# Patient Record
Sex: Male | Born: 1980 | Race: White | Hispanic: No | Marital: Single | Smoking: Current every day smoker
Health system: Southern US, Community
[De-identification: ages and names within clinical notes are randomized; demographics above are authoritative.]

## PROBLEM LIST (undated history)

## (undated) HISTORY — PX: OTHER SURGICAL HISTORY: SHX169

---

## 2009-03-22 ENCOUNTER — Emergency Department (HOSPITAL_COMMUNITY): Admission: EM | Admit: 2009-03-22 | Discharge: 2009-03-22 | Payer: Self-pay | Admitting: Emergency Medicine

## 2010-10-13 LAB — DIFFERENTIAL
Basophils Absolute: 0 10*3/uL (ref 0.0–0.1)
Basophils Relative: 0 % (ref 0–1)
Eosinophils Absolute: 0.2 10*3/uL (ref 0.0–0.7)
Eosinophils Relative: 3 % (ref 0–5)
Lymphocytes Relative: 30 % (ref 12–46)
Lymphs Abs: 2.3 10*3/uL (ref 0.7–4.0)
Monocytes Absolute: 0.8 10*3/uL (ref 0.1–1.0)
Monocytes Relative: 11 % (ref 3–12)
Neutro Abs: 4.2 10*3/uL (ref 1.7–7.7)
Neutrophils Relative %: 55 % (ref 43–77)

## 2010-10-13 LAB — CBC
HCT: 45.5 % (ref 39.0–52.0)
Hemoglobin: 16 g/dL (ref 13.0–17.0)
MCHC: 35.2 g/dL (ref 30.0–36.0)
MCV: 88.7 fL (ref 78.0–100.0)
Platelets: 234 10*3/uL (ref 150–400)
RBC: 5.13 MIL/uL (ref 4.22–5.81)
RDW: 13.4 % (ref 11.5–15.5)
WBC: 7.6 10*3/uL (ref 4.0–10.5)

## 2010-10-13 LAB — URINALYSIS, ROUTINE W REFLEX MICROSCOPIC
Bilirubin Urine: NEGATIVE
Glucose, UA: NEGATIVE mg/dL
Ketones, ur: NEGATIVE mg/dL
Nitrite: NEGATIVE
Protein, ur: NEGATIVE mg/dL
Specific Gravity, Urine: 1.02 (ref 1.005–1.030)
Urobilinogen, UA: 0.2 mg/dL (ref 0.0–1.0)
pH: 6 (ref 5.0–8.0)

## 2010-10-13 LAB — COMPREHENSIVE METABOLIC PANEL
ALT: 41 U/L (ref 0–53)
AST: 24 U/L (ref 0–37)
Albumin: 4.2 g/dL (ref 3.5–5.2)
Alkaline Phosphatase: 52 U/L (ref 39–117)
BUN: 13 mg/dL (ref 6–23)
CO2: 30 mEq/L (ref 19–32)
Calcium: 9.4 mg/dL (ref 8.4–10.5)
Chloride: 104 mEq/L (ref 96–112)
Creatinine, Ser: 1.13 mg/dL (ref 0.4–1.5)
GFR calc Af Amer: >60 mL/min (ref >60)
GFR calc non Af Amer: >60 mL/min (ref >60)
Glucose, Bld: 96 mg/dL (ref 70–99)
Potassium: 4.2 mEq/L (ref 3.5–5.1)
Sodium: 138 mEq/L (ref 135–145)
Total Bilirubin: 0.8 mg/dL (ref 0.3–1.2)
Total Protein: 7.2 g/dL (ref 6.0–8.3)

## 2010-10-13 LAB — URINE MICROSCOPIC-ADD ON

## 2010-10-13 LAB — LIPASE, BLOOD: Lipase: 17 U/L (ref 11–59)

## 2012-01-14 ENCOUNTER — Encounter (HOSPITAL_COMMUNITY): Payer: Self-pay | Admitting: *Deleted

## 2012-01-14 DIAGNOSIS — M549 Dorsalgia, unspecified: Secondary | ICD-10-CM | POA: Insufficient documentation

## 2012-01-14 DIAGNOSIS — M542 Cervicalgia: Secondary | ICD-10-CM | POA: Insufficient documentation
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
## 2012-01-15 ENCOUNTER — Emergency Department (HOSPITAL_COMMUNITY)
Admission: EM | Admit: 2012-01-15 | Discharge: 2012-01-15 | Disposition: A | Discharge status: Home / Self Care | Payer: Self-pay | Attending: Emergency Medicine | Admitting: Emergency Medicine

## 2012-01-15 DIAGNOSIS — M5412 Radiculopathy, cervical region: Secondary | ICD-10-CM

## 2012-01-15 MED ORDER — HYDROCODONE-ACETAMINOPHEN 5-325 MG PO TABS: 1.0000 | ORAL_TABLET | ORAL | Status: AC | PRN

## 2012-01-15 MED ORDER — METHYLPREDNISOLONE SODIUM SUCC 125 MG IJ SOLR
125.0000 mg | Freq: Once | INTRAMUSCULAR | Status: AC
  Administered 2012-01-15: 125 mg via INTRAMUSCULAR
  Filled 2012-01-15: qty 2

## 2012-01-15 MED ORDER — HYDROCODONE-ACETAMINOPHEN 5-325 MG PO TABS
2.0000 | ORAL_TABLET | Freq: Once | ORAL | Status: AC
  Administered 2012-01-15: 2 via ORAL
  Filled 2012-01-15: qty 2

## 2012-01-15 MED ORDER — PREDNISONE 10 MG PO TABS: ORAL_TABLET | ORAL | Status: DC

## 2012-01-15 MED ORDER — METHOCARBAMOL 500 MG PO TABS
1000.0000 mg | ORAL_TABLET | Freq: Once | ORAL | Status: AC
  Administered 2012-01-15: 1000 mg via ORAL
  Filled 2012-01-15: qty 2

## 2012-01-15 MED ORDER — ONDANSETRON HCL 4 MG PO TABS
4.0000 mg | ORAL_TABLET | Freq: Once | ORAL | Status: AC
  Administered 2012-01-15: 4 mg via ORAL
  Filled 2012-01-15: qty 1

## 2012-01-15 MED ORDER — METHYLPREDNISOLONE SODIUM SUCC 125 MG IJ SOLR: 125.0000 mg | Freq: Once | INTRAMUSCULAR | Status: DC

## 2012-01-15 MED ORDER — METHOCARBAMOL 500 MG PO TABS: ORAL_TABLET | ORAL | Status: DC

## 2012-01-15 NOTE — ED Provider Notes (Signed)
Medical screening examination/treatment/procedure(s) were performed by non-physician practitioner and as supervising physician I was immediately available for consultation/collaboration.  Sunnie Nielsen, MD 01/15/12 [phone]

## 2012-01-15 NOTE — ED Provider Notes (Signed)
History     CSN: [id number]  Arrival date & time 01/14/12  2300   First MD Initiated Contact with Patient 01/14/12 2343      Chief Complaint  Patient presents with  . Back Pain    (Consider location/radiation/quality/duration/timing/severity/associated sxs/prior treatment) Patient is a 31 y.o. male presenting with back pain. The history is provided by the patient.  Back Pain  This is a chronic problem. The problem occurs daily. The problem has been gradually worsening. The pain is associated with lifting heavy objects. Pain location: ceervical. The quality of the pain is described as aching. Radiates to: left arm. The pain is at a severity of 8/10. Exacerbated by: lifting. The pain is the same all the time. Pertinent negatives include no chest pain, no abdominal pain and no dysuria. He has tried NSAIDs for the symptoms.    History reviewed. No pertinent past medical history.  Past Surgical History  Procedure Date  . Tubes inears     History reviewed. No pertinent family history.  History  Substance Use Topics  . Smoking status: Never Smoker   . Smokeless tobacco: Not on file  . Alcohol Use: Yes      Review of Systems  Constitutional: Negative for activity change.       All ROS Neg except as noted in HPI  HENT: Negative for nosebleeds and neck pain.   Eyes: Negative for photophobia and discharge.  Respiratory: Negative for cough, shortness of breath and wheezing.   Cardiovascular: Negative for chest pain and palpitations.  Gastrointestinal: Negative for abdominal pain and blood in stool.  Genitourinary: Negative for dysuria, frequency and hematuria.  Musculoskeletal: Positive for back pain. Negative for arthralgias.  Skin: Negative.   Neurological: Negative for dizziness, seizures and speech difficulty.  Psychiatric/Behavioral: Negative for hallucinations and confusion.    Allergies  Review of patient's allergies indicates no known allergies.  Home Medications    No current outpatient prescriptions on file.  BP 151/87  Pulse 92  Temp 98.2 F (36.8 C) (Oral)  Resp 20  Ht 6' (1.829 m)  Wt 195 lb (88.451 kg)  BMI 26.45 kg/m2  SpO2 98%  Physical Exam  Nursing note and vitals reviewed. Constitutional: He is oriented to person, place, and time. He appears well-developed and well-nourished.  Non-toxic appearance.  HENT:  Head: Normocephalic.  Right Ear: Tympanic membrane and external ear normal.  Left Ear: Tympanic membrane and external ear normal.  Eyes: EOM and lids are normal. Pupils are equal, round, and reactive to light.  Neck: Normal range of motion. Neck supple. Carotid bruit is not present.  Cardiovascular: Normal rate, regular rhythm, normal heart sounds, intact distal pulses and normal pulses.   Pulmonary/Chest: Breath sounds normal. No respiratory distress.  Abdominal: Soft. Bowel sounds are normal. There is no tenderness. There is no guarding.  Musculoskeletal: Normal range of motion.       Pain to palpation and ROM at the lower cervical spine area. No step off. No hot areas.  Lymphadenopathy:       Head (right side): No submandibular adenopathy present.       Head (left side): No submandibular adenopathy present.    He has no cervical adenopathy.  Neurological: He is alert and oriented to person, place, and time. He has normal strength. No cranial nerve deficit or sensory deficit. He exhibits normal muscle tone. Coordination normal.       Shoulder shruggs and motor strength symmetrical. Sensory symetrical. Grip symmetrical.  Skin: Skin  is warm and dry.  Psychiatric: He has a normal mood and affect. His speech is normal.    ED Course  Procedures (including critical care time)  Labs Reviewed - No data to display No results found.   No diagnosis found.    MDM  I have reviewed nursing notes, vital signs, and all appropriate lab and imaging results for this patient. Pt reports upper back and neck pain for nearly 1 year.  Worse over the past 3 weeks. No loss of power in the upper extremities. Hx of "pinched nerve" in the past. Did not receive intervention. No gross neuro deficit at this time. Rx for robaxin, decadron and norco # 20 given to patient.  PCP Dr Christell Constant.       Kathie Dike, [place] 01/15/12 [phone]

## 2012-03-07 ENCOUNTER — Emergency Department (HOSPITAL_COMMUNITY)
Admission: EM | Admit: 2012-03-07 | Discharge: 2012-03-07 | Disposition: A | Discharge status: Home / Self Care | Payer: Self-pay | Attending: Emergency Medicine | Admitting: Emergency Medicine

## 2012-03-07 DIAGNOSIS — L989 Disorder of the skin and subcutaneous tissue, unspecified: Secondary | ICD-10-CM | POA: Insufficient documentation

## 2012-03-07 DIAGNOSIS — R509 Fever, unspecified: Secondary | ICD-10-CM | POA: Insufficient documentation

## 2012-03-07 LAB — COMPREHENSIVE METABOLIC PANEL
ALT: 30 U/L (ref 0–53)
AST: 28 U/L (ref 0–37)
Albumin: 3.8 g/dL (ref 3.5–5.2)
Alkaline Phosphatase: 55 U/L (ref 39–117)
BUN: 13 mg/dL (ref 6–23)
CO2: 30 mEq/L (ref 19–32)
Calcium: 9.8 mg/dL (ref 8.4–10.5)
Chloride: 100 mEq/L (ref 96–112)
Creatinine, Ser: 0.95 mg/dL (ref 0.50–1.35)
GFR calc Af Amer: >90 mL/min (ref >90)
GFR calc non Af Amer: >90 mL/min (ref >90)
Glucose, Bld: 111 mg/dL — ABNORMAL HIGH (ref 70–99)
Potassium: 4.7 mEq/L (ref 3.5–5.1)
Sodium: 137 mEq/L (ref 135–145)
Total Bilirubin: 0.4 mg/dL (ref 0.3–1.2)
Total Protein: 7.7 g/dL (ref 6.0–8.3)

## 2012-03-07 LAB — URINALYSIS, ROUTINE W REFLEX MICROSCOPIC
Bilirubin Urine: NEGATIVE
Glucose, UA: NEGATIVE mg/dL
Ketones, ur: NEGATIVE mg/dL
Leukocytes, UA: NEGATIVE
Nitrite: NEGATIVE
Protein, ur: NEGATIVE mg/dL
Specific Gravity, Urine: 1.025 (ref 1.005–1.030)
Urobilinogen, UA: 0.2 mg/dL (ref 0.0–1.0)
pH: 6 (ref 5.0–8.0)

## 2012-03-07 LAB — CBC WITH DIFFERENTIAL/PLATELET
Basophils Absolute: 0 10*3/uL (ref 0.0–0.1)
Basophils Relative: 0 % (ref 0–1)
Eosinophils Absolute: 0.1 10*3/uL (ref 0.0–0.7)
Eosinophils Relative: 1 % (ref 0–5)
HCT: 45.7 % (ref 39.0–52.0)
Hemoglobin: 16.1 g/dL (ref 13.0–17.0)
Lymphocytes Relative: 24 % (ref 12–46)
Lymphs Abs: 1.8 10*3/uL (ref 0.7–4.0)
MCH: 30.5 pg (ref 26.0–34.0)
MCHC: 35.2 g/dL (ref 30.0–36.0)
MCV: 86.6 fL (ref 78.0–100.0)
Monocytes Absolute: 1.2 10*3/uL — ABNORMAL HIGH (ref 0.1–1.0)
Monocytes Relative: 15 % — ABNORMAL HIGH (ref 3–12)
Neutro Abs: 4.5 10*3/uL (ref 1.7–7.7)
Neutrophils Relative %: 60 % (ref 43–77)
Platelets: 152 10*3/uL (ref 150–400)
RBC: 5.28 MIL/uL (ref 4.22–5.81)
RDW: 14.1 % (ref 11.5–15.5)
WBC Morphology: INCREASED
WBC: 7.6 10*3/uL (ref 4.0–10.5)

## 2012-03-07 LAB — URINE MICROSCOPIC-ADD ON

## 2012-03-07 MED ORDER — ONDANSETRON 8 MG PO TBDP
8.0000 mg | ORAL_TABLET | Freq: Once | ORAL | Status: AC
  Administered 2012-03-07: 8 mg via ORAL
  Filled 2012-03-07: qty 1

## 2012-03-07 MED ORDER — DOXYCYCLINE HYCLATE 100 MG PO TABS
100.0000 mg | ORAL_TABLET | Freq: Once | ORAL | Status: AC
  Administered 2012-03-07: 100 mg via ORAL
  Filled 2012-03-07: qty 1

## 2012-03-07 MED ORDER — KETOROLAC TROMETHAMINE 60 MG/2ML IM SOLN
60.0000 mg | Freq: Once | INTRAMUSCULAR | Status: AC
  Administered 2012-03-07: 60 mg via INTRAMUSCULAR
  Filled 2012-03-07: qty 2

## 2012-03-07 MED ORDER — DOXYCYCLINE HYCLATE 100 MG PO CAPS: 100.0000 mg | ORAL_CAPSULE | Freq: Two times a day (BID) | ORAL | Status: AC

## 2012-03-07 MED ORDER — PROMETHAZINE HCL 25 MG PO TABS: 25.0000 mg | ORAL_TABLET | Freq: Four times a day (QID) | ORAL | Status: DC | PRN

## 2012-03-07 NOTE — ED Notes (Signed)
Pt medicated for nauseas and generalized pain. Pt states he is unable to collect urine sample at this time.

## 2012-03-07 NOTE — ED Notes (Signed)
Pt states symptoms first began Tuesday. Denies vomiting. NAD.

## 2012-03-07 NOTE — ED Provider Notes (Signed)
History     CSN: [id number]  Arrival date & time 03/07/12  1224   First MD Initiated Contact with Patient 03/07/12 1229      Chief Complaint  Patient presents with  . Fever    (Consider location/radiation/quality/duration/timing/severity/associated sxs/prior treatment) HPI Comments: Patient c/o frontal headache, generalized weakness and intermittent fever x 3 days.  Also c/o watery stool 1-2 times yesterday and nausea.  Having fever, sweats and chills last evening.  Last took tylenol at 10:00 am this morning.  States he has been eating and drinking fluids normally.  He denies exposure to ticks recently, but notes that he had a large "red spot" to his left lower leg last week that has since improved.  He denies vomiting, melena, neck pain or stiffness, visual changes, cough, sore throat, ear pain, dizziness, abdominal pain or joint pains.  Patient is a 31 y.o. male presenting with fever. The history is provided by the patient.  Fever Primary symptoms of the febrile illness include fever, fatigue, headaches, nausea and diarrhea. Primary symptoms do not include visual change, cough, wheezing, shortness of breath, abdominal pain, vomiting, dysuria, altered mental status, myalgias, arthralgias or rash. The current episode started 3 to 5 days ago. This is a new problem. The problem has not changed since onset. The headache began more than 2 days ago. The headache developed gradually. Headache is a new problem. The headache is present intermittently. The headache is associated with weakness. The headache is not associated with aura, photophobia, double vision, eye pain, decreased vision, visual change, stiff neck, neck stiffness, paresthesias or loss of balance.  The diarrhea began 2 days ago. The diarrhea is semi-solid and watery. The diarrhea occurs 2 to 4 times per day. Risk factors: none.    No past medical history on file.  Past Surgical History  Procedure Date  . Tubes inears     No  family history on file.  History  Substance Use Topics  . Smoking status: Never Smoker   . Smokeless tobacco: Not on file  . Alcohol Use: Yes      Review of Systems  Constitutional: Positive for fever, chills and fatigue. Negative for activity change and appetite change.  HENT: Negative for ear pain, congestion, sore throat, facial swelling, trouble swallowing, neck pain and neck stiffness.   Eyes: Negative for double vision, photophobia and pain.  Respiratory: Negative for cough, chest tightness, shortness of breath and wheezing.   Cardiovascular: Negative for chest pain.  Gastrointestinal: Positive for nausea and diarrhea. Negative for vomiting, abdominal pain, blood in stool and abdominal distention.  Genitourinary: Negative for dysuria, hematuria, flank pain, decreased urine volume and difficulty urinating.  Musculoskeletal: Negative for myalgias, back pain and arthralgias.  Skin: Negative for rash.  Neurological: Positive for weakness and headaches. Negative for dizziness, syncope, facial asymmetry, speech difficulty, light-headedness, numbness, paresthesias and loss of balance.  Psychiatric/Behavioral: Negative for confusion, decreased concentration and altered mental status.  All other systems reviewed and are negative.    Allergies  Review of patient's allergies indicates no known allergies.  Home Medications   Current Outpatient Rx  Name Route Sig Dispense Refill  . METHOCARBAMOL 500 MG PO TABS  2 po tid for spasm 30 tablet 0  . PREDNISONE 10 MG PO TABS  6,5,4,3,2,1 - Take with food 21 tablet 0    BP 132/89  Pulse 79  Temp 98.2 F (36.8 C) (Oral)  Resp 20  Ht 6\' 2"  (1.88 m)  Wt 205 lb (92.987  kg)  BMI 26.32 kg/m2  SpO2 99%  Physical Exam  Nursing note and vitals reviewed. Constitutional: He is oriented to person, place, and time. He appears well-developed and well-nourished. No distress.  HENT:  Head: Normocephalic and atraumatic.  Right Ear: Tympanic  membrane and ear canal normal.  Left Ear: Tympanic membrane and ear canal normal.  Nose: Nose normal.  Mouth/Throat: Uvula is midline, oropharynx is clear and moist and mucous membranes are normal.  Eyes: Pupils are equal, round, and reactive to light.  Neck: Normal range of motion and phonation normal. Neck supple. No spinous process tenderness and no muscular tenderness present. Normal range of motion present. No Brudzinski's sign and no Kernig's sign noted.  Cardiovascular: Normal rate, regular rhythm, normal heart sounds and intact distal pulses.   No murmur heard. Pulmonary/Chest: Effort normal and breath sounds normal. No respiratory distress. He has no wheezes. He has no rales. He exhibits no tenderness.  Abdominal: Soft. He exhibits no distension and no mass. There is no tenderness. There is no rebound and no guarding.  Musculoskeletal: He exhibits no edema.  Lymphadenopathy:    He has no cervical adenopathy.  Neurological: He is alert and oriented to person, place, and time. He exhibits normal muscle tone. Coordination normal.  Skin: Skin is warm and dry.       Dime sized slightly erythematous macule to the distal left lower leg.  No pustule, petechiae, vesicles or edema.    ED Course  Procedures (including critical care time)  Labs Reviewed  CBC WITH DIFFERENTIAL - Abnormal; Notable for the following:    Monocytes Relative 15 (*)     Monocytes Absolute 1.2 (*)     All other components within normal limits  COMPREHENSIVE METABOLIC PANEL - Abnormal; Notable for the following:    Glucose, Bld 111 (*)     All other components within normal limits  URINALYSIS, ROUTINE W REFLEX MICROSCOPIC - Abnormal; Notable for the following:    Hgb urine dipstick LARGE (*)     All other components within normal limits  URINE MICROSCOPIC-ADD ON - Abnormal; Notable for the following:    Bacteria, UA FEW (*)     Casts WBC CAST (*)     All other components within normal limits  ROCKY MTN  SPOTTED FVR AB, IGG-BLOOD  ROCKY MTN SPOTTED FVR AB, IGM-BLOOD     RMSF titer is pending   MDM   Patient is non-toxic appearing, mucous membranes are moist, vitals reviewed.  No meningeal signs. Since pt reports hx of fever and no known origin, with lesion to left lower leg.  I will start doxy and obtain RMSF titer.  I have discussed possibility of tick related illness with the pt and he agrees to care plan and close f/u with his PMD or to return here if his sx's worsen.    Spoke with [place] in lab, advised to order both RMSF IgG and IgM for tier.  The patient appears reasonably screened and/or stabilized for discharge and I doubt any other medical condition or other [facility] requiring further screening, evaluation, or treatment in the ED at this time prior to discharge.        Chrissi Crow L. [place], [place] 03/08/12 2205

## 2012-03-07 NOTE — ED Notes (Signed)
Fever, feels weak, headache, nausea,diarrhea yesterday.  T 104 last pm .  No cough.

## 2012-03-10 NOTE — ED Provider Notes (Signed)
Medical screening examination/treatment/procedure(s) were performed by non-physician practitioner and as supervising physician I was immediately available for consultation/collaboration.   Wilkin Lippy W. Ladona Rosten, MD 03/10/12 1009 

## 2012-03-11 LAB — ROCKY MTN SPOTTED FVR AB, IGG-BLOOD: RMSF IgG: 0.53 IV

## 2012-03-11 LAB — ROCKY MTN SPOTTED FVR AB, IGM-BLOOD: RMSF IgM: 0.68 IV (ref 0.00–0.89)

## 2012-12-03 ENCOUNTER — Emergency Department (HOSPITAL_COMMUNITY)
Admission: EM | Admit: 2012-12-03 | Discharge: 2012-12-03 | Discharge status: Against Medical Advice | Payer: Self-pay | Attending: Emergency Medicine | Admitting: Emergency Medicine

## 2012-12-03 ENCOUNTER — Encounter (HOSPITAL_COMMUNITY): Payer: Self-pay | Admitting: *Deleted

## 2012-12-03 DIAGNOSIS — R509 Fever, unspecified: Secondary | ICD-10-CM | POA: Insufficient documentation

## 2012-12-03 DIAGNOSIS — R112 Nausea with vomiting, unspecified: Secondary | ICD-10-CM | POA: Insufficient documentation

## 2012-12-03 DIAGNOSIS — R1310 Dysphagia, unspecified: Secondary | ICD-10-CM | POA: Insufficient documentation

## 2012-12-03 DIAGNOSIS — H538 Other visual disturbances: Secondary | ICD-10-CM | POA: Insufficient documentation

## 2012-12-03 DIAGNOSIS — R109 Unspecified abdominal pain: Secondary | ICD-10-CM | POA: Insufficient documentation

## 2012-12-03 DIAGNOSIS — M549 Dorsalgia, unspecified: Secondary | ICD-10-CM | POA: Insufficient documentation

## 2012-12-03 DIAGNOSIS — R42 Dizziness and giddiness: Secondary | ICD-10-CM | POA: Insufficient documentation

## 2012-12-03 DIAGNOSIS — R51 Headache: Secondary | ICD-10-CM | POA: Insufficient documentation

## 2012-12-03 DIAGNOSIS — J029 Acute pharyngitis, unspecified: Secondary | ICD-10-CM | POA: Insufficient documentation

## 2012-12-03 DIAGNOSIS — M79609 Pain in unspecified limb: Secondary | ICD-10-CM | POA: Insufficient documentation

## 2012-12-03 DIAGNOSIS — IMO0001 Reserved for inherently not codable concepts without codable children: Secondary | ICD-10-CM | POA: Insufficient documentation

## 2012-12-03 LAB — CBC
HCT: 46.1 % (ref 39.0–52.0)
Hemoglobin: 16.4 g/dL (ref 13.0–17.0)
MCH: 31.3 pg (ref 26.0–34.0)
MCHC: 35.6 g/dL (ref 30.0–36.0)
MCV: 88 fL (ref 78.0–100.0)
Platelets: 189 10*3/uL (ref 150–400)
RBC: 5.24 MIL/uL (ref 4.22–5.81)
RDW: 13.1 % (ref 11.5–15.5)
WBC: 14.4 10*3/uL — ABNORMAL HIGH (ref 4.0–10.5)

## 2012-12-03 LAB — URINE MICROSCOPIC-ADD ON

## 2012-12-03 LAB — URINALYSIS, ROUTINE W REFLEX MICROSCOPIC
Bilirubin Urine: NEGATIVE
Glucose, UA: NEGATIVE mg/dL
Ketones, ur: NEGATIVE mg/dL
Leukocytes, UA: NEGATIVE
Nitrite: NEGATIVE
Protein, ur: NEGATIVE mg/dL
Specific Gravity, Urine: <1.005 — ABNORMAL LOW (ref 1.005–1.030)
Urobilinogen, UA: 0.2 mg/dL (ref 0.0–1.0)
pH: 5.5 (ref 5.0–8.0)

## 2012-12-03 LAB — BASIC METABOLIC PANEL
BUN: 7 mg/dL (ref 6–23)
CO2: 26 mEq/L (ref 19–32)
Calcium: 9.5 mg/dL (ref 8.4–10.5)
Chloride: 94 mEq/L — ABNORMAL LOW (ref 96–112)
Creatinine, Ser: 0.86 mg/dL (ref 0.50–1.35)
GFR calc Af Amer: >90 mL/min (ref >90)
GFR calc non Af Amer: >90 mL/min (ref >90)
Glucose, Bld: 126 mg/dL — ABNORMAL HIGH (ref 70–99)
Potassium: 3.5 mEq/L (ref 3.5–5.1)
Sodium: 134 mEq/L — ABNORMAL LOW (ref 135–145)

## 2012-12-03 NOTE — ED Provider Notes (Signed)
History    Scribed for No att. providers found, the patient was seen in room APA18/APA18. This chart was scribed by Lewanda Rife, ED scribe. Patient's care was started at 2000  CSN: [id number]  Arrival date & time 12/03/12  1554   First MD Initiated Contact with Patient 12/03/12 1933      Chief Complaint  Patient presents with  . Nausea  . Headache  . Fever    (Consider location/radiation/quality/duration/timing/severity/associated sxs/prior treatment) The history is provided by the patient.  HPI Comments: Phillip Abbott is a 32 y.o. male who presents to the Emergency Department complaining of several episodes of emesis onset 2 days. Reports associated improving waxing and waning nausea, fever highest of 103, generalized myalgias, dizziness, sore throat, painful swallowing, blurry vision, headache, bilateral leg pain, back pain, and abdominal pains. Denies having episodes of emesis, nausea, and abdominal pain today. Denies associated cough, diarrhea, rash, chest pain, shortness of breath, leg swelling, dysuria, hematuria, and bleeding disorder. Denies any aggravating and alleviating symptoms. Denies taking medications PTA to relieve symptoms. No PCP. Reports hx of strep throat.  History reviewed. No pertinent past medical history.  Past Surgical History  Procedure Laterality Date  . Tubes inears      No family history on file.  History  Substance Use Topics  . Smoking status: Never Smoker   . Smokeless tobacco: Not on file  . Alcohol Use: Yes      Review of Systems  Constitutional: Positive for fever.  HENT: Positive for sore throat. Negative for neck pain.        Painful swallowing   Eyes: Positive for visual disturbance. Negative for redness.  Respiratory: Negative for cough and shortness of breath.   Cardiovascular: Negative for chest pain and leg swelling.  Gastrointestinal: Positive for nausea, vomiting and abdominal pain. Negative for diarrhea.   Genitourinary: Negative for dysuria and hematuria.  Musculoskeletal: Positive for myalgias and back pain.  Skin: Negative for rash.  Neurological: Positive for dizziness and headaches.  Hematological: Does not bruise/bleed easily.  Psychiatric/Behavioral: Negative for confusion.   A complete 10 system review of systems was obtained and all systems are negative except as noted in the HPI and PMH.   Allergies  Review of patient's allergies indicates no known allergies.  Home Medications   Current Outpatient Rx  Name  Route  Sig  Dispense  Refill  . acetaminophen (TYLENOL) 500 MG tablet   Oral   Take 500 mg by mouth every 6 (six) hours as needed for pain.           BP 163/99  Pulse 113  Temp(Src) 100.1 F (37.8 C) (Oral)  Resp 16  Ht 6' (1.829 m)  Wt 206 lb (93.441 kg)  BMI 27.93 kg/m2  SpO2 97%  Physical Exam  Nursing note and vitals reviewed. Constitutional: He is oriented to person, place, and time. He appears well-developed and well-nourished. No distress.  HENT:  Head: Normocephalic and atraumatic.  Mouth/Throat: Uvula is midline and mucous membranes are normal. Oropharyngeal exudate and posterior oropharyngeal erythema present.  Eyes: Conjunctivae and EOM are normal. No scleral icterus.  Neck: Normal range of motion. Neck supple. No tracheal deviation present.  Cardiovascular: Normal rate, regular rhythm and normal heart sounds.   Pulmonary/Chest: Effort normal and breath sounds normal. No respiratory distress. He has no wheezes. He has no rales.  Abdominal: Soft. Bowel sounds are normal. He exhibits no distension. There is no tenderness.  Musculoskeletal: Normal range of motion.  Lymphadenopathy:    He has no cervical adenopathy.  Neurological: He is alert and oriented to person, place, and time.  Skin: Skin is warm and dry.  Psychiatric: He has a normal mood and affect. His behavior is normal.    ED Course  Procedures (including critical care  time) Medications - No data to display 8:15 PM PT decided to leave AMA after being informed of additional testing to be completed including rapid strep test, basic labs, and chest x-ray  Labs Reviewed  CBC - Abnormal; Notable for the following:    WBC 14.4 (*)    All other components within normal limits  BASIC METABOLIC PANEL - Abnormal; Notable for the following:    Sodium 134 (*)    Chloride 94 (*)    Glucose, Bld 126 (*)    All other components within normal limits  URINALYSIS, ROUTINE W REFLEX MICROSCOPIC - Abnormal; Notable for the following:    Specific Gravity, Urine <1.005 (*)    Hgb urine dipstick LARGE (*)    All other components within normal limits  URINE MICROSCOPIC-ADD ON   No results found. Results for orders placed during the hospital encounter of 12/03/12  CBC      Result Value Range   WBC 14.4 (*) 4.0 - 10.5 K/uL   RBC 5.24  4.22 - 5.81 MIL/uL   Hemoglobin 16.4  13.0 - 17.0 g/dL   HCT 16.1  09.6 - 04.5 %   MCV 88.0  78.0 - 100.0 fL   MCH 31.3  26.0 - 34.0 pg   MCHC 35.6  30.0 - 36.0 g/dL   RDW 40.9  81.1 - 91.4 %   Platelets 189  150 - 400 K/uL  BASIC METABOLIC PANEL      Result Value Range   Sodium 134 (*) 135 - 145 mEq/L   Potassium 3.5  3.5 - 5.1 mEq/L   Chloride 94 (*) 96 - 112 mEq/L   CO2 26  19 - 32 mEq/L   Glucose, Bld 126 (*) 70 - 99 mg/dL   BUN 7  6 - 23 mg/dL   Creatinine, Ser 7.82  0.50 - 1.35 mg/dL   Calcium 9.5  8.4 - 95.6 mg/dL   GFR calc non Af Amer >90  >90 mL/min   GFR calc Af Amer >90  >90 mL/min  URINALYSIS, ROUTINE W REFLEX MICROSCOPIC      Result Value Range   Color, Urine YELLOW  YELLOW   APPearance CLEAR  CLEAR   Specific Gravity, Urine <1.005 (*) 1.005 - 1.030   pH 5.5  5.0 - 8.0   Glucose, UA NEGATIVE  NEGATIVE mg/dL   Hgb urine dipstick LARGE (*) NEGATIVE   Bilirubin Urine NEGATIVE  NEGATIVE   Ketones, ur NEGATIVE  NEGATIVE mg/dL   Protein, ur NEGATIVE  NEGATIVE mg/dL   Urobilinogen, UA 0.2  0.0 - 1.0 mg/dL   Nitrite  NEGATIVE  NEGATIVE   Leukocytes, UA NEGATIVE  NEGATIVE  URINE MICROSCOPIC-ADD ON      Result Value Range   WBC, UA 0-2  <3 WBC/hpf   RBC / HPF 3-6  <3 RBC/hpf   Bacteria, UA RARE  RARE     1. Fever   2. Pharyngitis       MDM  Patient unfortunately had a long way but was seen by [place] and plan was in place for strep test and basic labs and a chest x-ray patient decided he wanted to go home. Clinically is very  suspicious for strep throat. Patient says the amoxicillin at home and he would you start taking that. Patient was welcome to return at any point in time. Patient currently nontoxic no acute distress but does have a low-grade fever. Question saturations were normal. Patient does have a leukocytosis urinalysis was negative and not consistent with dehydration.      I personally performed the services described in this documentation, which was scribed in my presence. The recorded information has been reviewed and is accurate.     Shelda Jakes, MD 12/03/12 2041

## 2012-12-03 NOTE — ED Notes (Signed)
Pt states "violent vomiting" 2 days ago with abdominal pain which has subsided. Pt states nausea, fever (highest of 104). Generalized weakness, headache, rib pain. Dizziness.

## 2012-12-03 NOTE — ED Notes (Signed)
Patient stated he didn't want to stay for further testing.  Patient instructed to return to ER for any worsening symptoms.  Patient ambulatory; signed AMA form.  Benefits and risks given to patient.

## 2013-01-25 ENCOUNTER — Emergency Department (HOSPITAL_COMMUNITY)
Admission: EM | Admit: 2013-01-25 | Discharge: 2013-01-25 | Discharge status: Against Medical Advice | Payer: Self-pay | Attending: Emergency Medicine | Admitting: Emergency Medicine

## 2013-01-25 ENCOUNTER — Encounter (HOSPITAL_COMMUNITY): Payer: Self-pay

## 2013-01-25 DIAGNOSIS — F1910 Other psychoactive substance abuse, uncomplicated: Secondary | ICD-10-CM | POA: Insufficient documentation

## 2013-01-25 DIAGNOSIS — F17200 Nicotine dependence, unspecified, uncomplicated: Secondary | ICD-10-CM | POA: Insufficient documentation

## 2013-01-25 NOTE — ED Notes (Signed)
Complain of headache and blood pressure elevated. States he uses cocaine three days ago.

## 2013-01-25 NOTE — ED Notes (Signed)
Delay explained to pt and that EDP was coming to see pt next. Pt reported," i am ready to leave." pt told risks of leaving and still reported he wanted to leave. EDP aware.

## 2013-01-25 NOTE — ED Notes (Addendum)
Pt becoming agitated and reported he wanted to live AMA. Talked with pt on risks of leaving but that ED staff could not make pt stay. Pt family at bedside and has convinced pt to stay. Pt reports " i will sit up here all night and they will tell me its because of cocaine." Pt reports, "it is not because of cocaine, this has happened before and that episode was several days out from my last use of cocaine as well." Pt mother reports that pt has bloody stools x1 year. Pt reports "i drink a lot everyday, that's all that is." Pt mother reports her brother presented the same way my son is now when they found out my brother had colon cancer.

## 2014-05-19 ENCOUNTER — Encounter (HOSPITAL_COMMUNITY): Payer: Self-pay | Admitting: Emergency Medicine

## 2014-05-19 ENCOUNTER — Emergency Department (HOSPITAL_COMMUNITY)
Admission: EM | Admit: 2014-05-19 | Discharge: 2014-05-20 | Disposition: A | Discharge status: Home / Self Care | Payer: 59 | Attending: Emergency Medicine | Admitting: Emergency Medicine

## 2014-05-19 DIAGNOSIS — Z046 Encounter for general psychiatric examination, requested by authority: Secondary | ICD-10-CM | POA: Diagnosis present

## 2014-05-19 DIAGNOSIS — F10920 Alcohol use, unspecified with intoxication, uncomplicated: Secondary | ICD-10-CM

## 2014-05-19 DIAGNOSIS — F10129 Alcohol abuse with intoxication, unspecified: Secondary | ICD-10-CM | POA: Diagnosis not present

## 2014-05-19 DIAGNOSIS — Z720 Tobacco use: Secondary | ICD-10-CM | POA: Diagnosis not present

## 2014-05-19 LAB — CBC WITH DIFFERENTIAL/PLATELET
Basophils Absolute: 0 10*3/uL (ref 0.0–0.1)
Basophils Relative: 0 % (ref 0–1)
Eosinophils Absolute: 0.2 10*3/uL (ref 0.0–0.7)
Eosinophils Relative: 2 % (ref 0–5)
HCT: 48.5 % (ref 39.0–52.0)
Hemoglobin: 17.2 g/dL — ABNORMAL HIGH (ref 13.0–17.0)
Lymphocytes Relative: 15 % (ref 12–46)
Lymphs Abs: 1.5 10*3/uL (ref 0.7–4.0)
MCH: 30.8 pg (ref 26.0–34.0)
MCHC: 35.5 g/dL (ref 30.0–36.0)
MCV: 86.8 fL (ref 78.0–100.0)
Monocytes Absolute: 0.7 10*3/uL (ref 0.1–1.0)
Monocytes Relative: 7 % (ref 3–12)
Neutro Abs: 7.6 10*3/uL (ref 1.7–7.7)
Neutrophils Relative %: 76 % (ref 43–77)
Platelets: 239 10*3/uL (ref 150–400)
RBC: 5.59 MIL/uL (ref 4.22–5.81)
RDW: 13.3 % (ref 11.5–15.5)
WBC: 9.9 10*3/uL (ref 4.0–10.5)

## 2014-05-19 LAB — COMPREHENSIVE METABOLIC PANEL
ALT: 40 U/L (ref 0–53)
AST: 35 U/L (ref 0–37)
Albumin: 4.5 g/dL (ref 3.5–5.2)
Alkaline Phosphatase: 69 U/L (ref 39–117)
Anion gap: 16 — ABNORMAL HIGH (ref 5–15)
BUN: 10 mg/dL (ref 6–23)
CO2: 24 mEq/L (ref 19–32)
Calcium: 9.2 mg/dL (ref 8.4–10.5)
Chloride: 100 mEq/L (ref 96–112)
Creatinine, Ser: 1.01 mg/dL (ref 0.50–1.35)
GFR calc Af Amer: >90 mL/min (ref >90)
GFR calc non Af Amer: >90 mL/min (ref >90)
Glucose, Bld: 95 mg/dL (ref 70–99)
Potassium: 4.2 mEq/L (ref 3.7–5.3)
Sodium: 140 mEq/L (ref 137–147)
Total Bilirubin: 0.3 mg/dL (ref 0.3–1.2)
Total Protein: 8.1 g/dL (ref 6.0–8.3)

## 2014-05-19 LAB — URINALYSIS, ROUTINE W REFLEX MICROSCOPIC
Bilirubin Urine: NEGATIVE
Glucose, UA: NEGATIVE mg/dL
Hgb urine dipstick: NEGATIVE
Ketones, ur: NEGATIVE mg/dL
Leukocytes, UA: NEGATIVE
Nitrite: NEGATIVE
Protein, ur: NEGATIVE mg/dL
Specific Gravity, Urine: <1.005 — ABNORMAL LOW (ref 1.005–1.030)
Urobilinogen, UA: 0.2 mg/dL (ref 0.0–1.0)
pH: 6 (ref 5.0–8.0)

## 2014-05-19 LAB — RAPID URINE DRUG SCREEN, HOSP PERFORMED
Amphetamines: NOT DETECTED
Barbiturates: NOT DETECTED
Benzodiazepines: NOT DETECTED
Cocaine: NOT DETECTED
Opiates: NOT DETECTED
Tetrahydrocannabinol: NOT DETECTED

## 2014-05-19 LAB — ETHANOL: Alcohol, Ethyl (B): 149 mg/dL — ABNORMAL HIGH (ref 0–11)

## 2014-05-19 MED ORDER — VITAMIN B-1 100 MG PO TABS
100.0000 mg | ORAL_TABLET | Freq: Every day | ORAL | Status: DC
  Administered 2014-05-19: 100 mg via ORAL
  Filled 2014-05-19: qty 1

## 2014-05-19 MED ORDER — LORAZEPAM 1 MG PO TABS
0.0000 mg | ORAL_TABLET | Freq: Four times a day (QID) | ORAL | Status: DC
  Administered 2014-05-19: 1 mg via ORAL
  Filled 2014-05-19: qty 1
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
## 2014-05-19 NOTE — Discharge Instructions (Signed)
Follow up with out pt tx °

## 2014-05-19 NOTE — BH Assessment (Addendum)
Reviewed notes prior to initiating assessment. Pt is medically cleared.   Requested cart be placed with pt for assessment.   Assessment to begin shortly.   2218 requested IVC paperwork be faxed for review.   Clista BernhardtNancy Madysen Faircloth, [facility] Triage Specialist 05/19/2014 10:04 PM

## 2014-05-19 NOTE — BH Assessment (Signed)
Attempted to contact petitioner to gather information. No answer VM was left.   Requested IVC paperwork to be faxed for review as it did not come through.   Phillip BernhardtNancy Annaliyah Abbott, [facility] Triage Specialist 05/19/2014 10:35 PM

## 2014-05-19 NOTE — ED Notes (Signed)
Patient went to restroom and provided urine specimen.  Notified nurse that urine looks just like "water".  Nurse stated to send to lab and have it analyzed.  If it is water - patient must provide urine specimen again with a witness present.

## 2014-05-19 NOTE — ED Notes (Signed)
[facility].

## 2014-11-09 DIAGNOSIS — K559 Vascular disorder of intestine, unspecified: Principal | ICD-10-CM | POA: Diagnosis present

## 2014-11-09 DIAGNOSIS — F1010 Alcohol abuse, uncomplicated: Secondary | ICD-10-CM | POA: Diagnosis present

## 2014-11-09 DIAGNOSIS — R112 Nausea with vomiting, unspecified: Secondary | ICD-10-CM | POA: Diagnosis not present

## 2014-11-09 DIAGNOSIS — F17210 Nicotine dependence, cigarettes, uncomplicated: Secondary | ICD-10-CM | POA: Diagnosis present

## 2014-11-09 DIAGNOSIS — F1410 Cocaine abuse, uncomplicated: Secondary | ICD-10-CM | POA: Diagnosis present

## 2014-11-09 DIAGNOSIS — F419 Anxiety disorder, unspecified: Secondary | ICD-10-CM | POA: Diagnosis present

## 2014-11-09 DIAGNOSIS — F1120 Opioid dependence, uncomplicated: Secondary | ICD-10-CM | POA: Diagnosis present

## 2014-11-09 DIAGNOSIS — Z800 Family history of malignant neoplasm of digestive organs: Secondary | ICD-10-CM

## 2014-11-09 DIAGNOSIS — F329 Major depressive disorder, single episode, unspecified: Secondary | ICD-10-CM | POA: Diagnosis present

## 2014-11-09 DIAGNOSIS — E876 Hypokalemia: Secondary | ICD-10-CM | POA: Diagnosis present

## 2014-11-09 NOTE — ED Notes (Signed)
Pt reporting severe abdominal pain since yesterday.  Reports bloody stool with clots.  Reports vomiting, but unsure if any blood. Reporting fever and chills as well.

## 2014-11-10 ENCOUNTER — Encounter (HOSPITAL_COMMUNITY): Payer: Self-pay | Admitting: *Deleted

## 2014-11-10 ENCOUNTER — Emergency Department (HOSPITAL_COMMUNITY): Payer: 59

## 2014-11-10 ENCOUNTER — Inpatient Hospital Stay (HOSPITAL_COMMUNITY)
Admission: EM | Admit: 2014-11-10 | Discharge: 2014-11-11 | DRG: 394 | Disposition: A | Discharge status: Home / Self Care | Payer: 59 | Attending: Internal Medicine | Admitting: Internal Medicine

## 2014-11-10 DIAGNOSIS — F419 Anxiety disorder, unspecified: Secondary | ICD-10-CM | POA: Diagnosis present

## 2014-11-10 DIAGNOSIS — F1010 Alcohol abuse, uncomplicated: Secondary | ICD-10-CM | POA: Diagnosis present

## 2014-11-10 DIAGNOSIS — F17210 Nicotine dependence, cigarettes, uncomplicated: Secondary | ICD-10-CM | POA: Diagnosis present

## 2014-11-10 DIAGNOSIS — F1110 Opioid abuse, uncomplicated: Secondary | ICD-10-CM

## 2014-11-10 DIAGNOSIS — F1410 Cocaine abuse, uncomplicated: Secondary | ICD-10-CM | POA: Diagnosis present

## 2014-11-10 DIAGNOSIS — F1120 Opioid dependence, uncomplicated: Secondary | ICD-10-CM | POA: Diagnosis present

## 2014-11-10 DIAGNOSIS — Z800 Family history of malignant neoplasm of digestive organs: Secondary | ICD-10-CM | POA: Diagnosis not present

## 2014-11-10 DIAGNOSIS — R109 Unspecified abdominal pain: Secondary | ICD-10-CM

## 2014-11-10 DIAGNOSIS — E876 Hypokalemia: Secondary | ICD-10-CM | POA: Diagnosis present

## 2014-11-10 DIAGNOSIS — K921 Melena: Secondary | ICD-10-CM | POA: Diagnosis present

## 2014-11-10 DIAGNOSIS — K5289 Other specified noninfective gastroenteritis and colitis: Secondary | ICD-10-CM

## 2014-11-10 DIAGNOSIS — K529 Noninfective gastroenteritis and colitis, unspecified: Secondary | ICD-10-CM | POA: Diagnosis present

## 2014-11-10 DIAGNOSIS — K559 Vascular disorder of intestine, unspecified: Secondary | ICD-10-CM | POA: Diagnosis present

## 2014-11-10 DIAGNOSIS — F329 Major depressive disorder, single episode, unspecified: Secondary | ICD-10-CM | POA: Diagnosis present

## 2014-11-10 DIAGNOSIS — K625 Hemorrhage of anus and rectum: Secondary | ICD-10-CM

## 2014-11-10 DIAGNOSIS — R112 Nausea with vomiting, unspecified: Secondary | ICD-10-CM | POA: Diagnosis present

## 2014-11-10 LAB — COMPREHENSIVE METABOLIC PANEL
ALT: 31 U/L (ref 17–63)
AST: 23 U/L (ref 15–41)
Albumin: 4.2 g/dL (ref 3.5–5.0)
Alkaline Phosphatase: 54 U/L (ref 38–126)
Anion gap: 6 (ref 5–15)
BUN: 8 mg/dL (ref 6–20)
CO2: 28 mmol/L (ref 22–32)
Calcium: 8.8 mg/dL — ABNORMAL LOW (ref 8.9–10.3)
Chloride: 102 mmol/L (ref 101–111)
Creatinine, Ser: 0.95 mg/dL (ref 0.61–1.24)
GFR calc Af Amer: >60 mL/min (ref >60)
GFR calc non Af Amer: >60 mL/min (ref >60)
Glucose, Bld: 100 mg/dL — ABNORMAL HIGH (ref 70–99)
Potassium: 3.4 mmol/L — ABNORMAL LOW (ref 3.5–5.1)
Sodium: 136 mmol/L (ref 135–145)
Total Bilirubin: 0.5 mg/dL (ref 0.3–1.2)
Total Protein: 7.6 g/dL (ref 6.5–8.1)

## 2014-11-10 LAB — URINE MICROSCOPIC-ADD ON

## 2014-11-10 LAB — CBC WITH DIFFERENTIAL/PLATELET
Basophils Absolute: 0 10*3/uL (ref 0.0–0.1)
Basophils Relative: 0 % (ref 0–1)
Eosinophils Absolute: 0.1 10*3/uL (ref 0.0–0.7)
Eosinophils Relative: 1 % (ref 0–5)
HCT: 46.8 % (ref 39.0–52.0)
Hemoglobin: 16.1 g/dL (ref 13.0–17.0)
Lymphocytes Relative: 12 % (ref 12–46)
Lymphs Abs: 1.7 10*3/uL (ref 0.7–4.0)
MCH: 30.3 pg (ref 26.0–34.0)
MCHC: 34.4 g/dL (ref 30.0–36.0)
MCV: 88 fL (ref 78.0–100.0)
Monocytes Absolute: 1.7 10*3/uL — ABNORMAL HIGH (ref 0.1–1.0)
Monocytes Relative: 12 % (ref 3–12)
Neutro Abs: 10.8 10*3/uL — ABNORMAL HIGH (ref 1.7–7.7)
Neutrophils Relative %: 75 % (ref 43–77)
Platelets: 230 10*3/uL (ref 150–400)
RBC: 5.32 MIL/uL (ref 4.22–5.81)
RDW: 13.3 % (ref 11.5–15.5)
WBC: 14.3 10*3/uL — ABNORMAL HIGH (ref 4.0–10.5)

## 2014-11-10 LAB — RAPID URINE DRUG SCREEN, HOSP PERFORMED
Amphetamines: NOT DETECTED
Barbiturates: NOT DETECTED
Benzodiazepines: POSITIVE — AB
Cocaine: POSITIVE — AB
Opiates: POSITIVE — AB
Tetrahydrocannabinol: NOT DETECTED

## 2014-11-10 LAB — URINALYSIS, ROUTINE W REFLEX MICROSCOPIC
Bilirubin Urine: NEGATIVE
Glucose, UA: NEGATIVE mg/dL
Ketones, ur: NEGATIVE mg/dL
Leukocytes, UA: NEGATIVE
Nitrite: NEGATIVE
Protein, ur: NEGATIVE mg/dL
Specific Gravity, Urine: 1.02 (ref 1.005–1.030)
Urobilinogen, UA: 0.2 mg/dL (ref 0.0–1.0)
pH: 7 (ref 5.0–8.0)

## 2014-11-10 LAB — TSH: TSH: 0.386 u[IU]/mL (ref 0.350–4.500)

## 2014-11-10 LAB — PROTIME-INR
INR: 1.08 (ref 0.00–1.49)
Prothrombin Time: 14.1 seconds (ref 11.6–15.2)

## 2014-11-10 LAB — LIPASE, BLOOD: Lipase: 26 U/L (ref 22–51)

## 2014-11-10 LAB — APTT: aPTT: 26 seconds (ref 24–37)

## 2014-11-10 LAB — SEDIMENTATION RATE: Sed Rate: 10 mm/hr (ref 0–16)

## 2014-11-10 LAB — ETHANOL: Alcohol, Ethyl (B): <5 mg/dL (ref <5)

## 2014-11-10 LAB — POC OCCULT BLOOD, ED: Fecal Occult Bld: POSITIVE — AB

## 2014-11-10 LAB — SAMPLE TO BLOOD BANK

## 2014-11-10 MED ORDER — VITAMIN B-1 100 MG PO TABS
100.0000 mg | ORAL_TABLET | Freq: Every day | ORAL | Status: DC
  Administered 2014-11-10 – 2014-11-11 (×2): 100 mg via ORAL
  Filled 2014-11-10 (×2): qty 1

## 2014-11-10 MED ORDER — THIAMINE HCL 100 MG/ML IJ SOLN: 100.0000 mg | Freq: Every day | INTRAMUSCULAR | Status: DC

## 2014-11-10 MED ORDER — SODIUM CHLORIDE 0.9 % IJ SOLN
3.0000 mL | Freq: Two times a day (BID) | INTRAMUSCULAR | Status: DC
  Administered 2014-11-10 – 2014-11-11 (×2): 3 mL via INTRAVENOUS

## 2014-11-10 MED ORDER — SODIUM CHLORIDE 0.9 % IV SOLN
1000.0000 mL | Freq: Once | INTRAVENOUS | Status: AC
  Administered 2014-11-10: 1000 mL via INTRAVENOUS

## 2014-11-10 MED ORDER — PROMETHAZINE HCL 25 MG/ML IJ SOLN
12.5000 mg | Freq: Once | INTRAMUSCULAR | Status: AC
  Administered 2014-11-10: 12.5 mg via INTRAVENOUS
  Filled 2014-11-10: qty 1

## 2014-11-10 MED ORDER — OXYCODONE HCL 5 MG PO TABS
10.0000 mg | ORAL_TABLET | ORAL | Status: DC | PRN
  Administered 2014-11-10 – 2014-11-11 (×6): 10 mg via ORAL
  Filled 2014-11-10 (×6): qty 2

## 2014-11-10 MED ORDER — ACETAMINOPHEN 325 MG PO TABS: 650.0000 mg | ORAL_TABLET | Freq: Four times a day (QID) | ORAL | Status: DC | PRN

## 2014-11-10 MED ORDER — FOLIC ACID 1 MG PO TABS
1.0000 mg | ORAL_TABLET | Freq: Every day | ORAL | Status: DC
  Administered 2014-11-10 – 2014-11-11 (×2): 1 mg via ORAL
  Filled 2014-11-10 (×2): qty 1

## 2014-11-10 MED ORDER — IOHEXOL 300 MG/ML  SOLN
100.0000 mL | Freq: Once | INTRAMUSCULAR | Status: AC | PRN
  Administered 2014-11-10: 100 mL via INTRAVENOUS

## 2014-11-10 MED ORDER — HYDROMORPHONE HCL 1 MG/ML IJ SOLN: 0.5000 mg | INTRAMUSCULAR | Status: DC | PRN

## 2014-11-10 MED ORDER — IOHEXOL 300 MG/ML  SOLN
50.0000 mL | Freq: Once | INTRAMUSCULAR | Status: AC | PRN
  Administered 2014-11-10: 50 mL via ORAL

## 2014-11-10 MED ORDER — FENTANYL CITRATE (PF) 100 MCG/2ML IJ SOLN
50.0000 ug | Freq: Once | INTRAMUSCULAR | Status: AC
  Administered 2014-11-10: 50 ug via INTRAVENOUS
  Filled 2014-11-10: qty 2

## 2014-11-10 MED ORDER — KCL IN DEXTROSE-NACL 40-5-0.9 MEQ/L-%-% IV SOLN
INTRAVENOUS | Status: DC
  Administered 2014-11-10: 125 mL/h via INTRAVENOUS
  Administered 2014-11-10: 12:00:00 via INTRAVENOUS
  Filled 2014-11-10 (×5): qty 1000

## 2014-11-10 MED ORDER — ONDANSETRON HCL 4 MG/2ML IJ SOLN: 4.0000 mg | Freq: Four times a day (QID) | INTRAMUSCULAR | Status: DC | PRN

## 2014-11-10 MED ORDER — SODIUM CHLORIDE 0.9 % IV SOLN: 1000.0000 mL | INTRAVENOUS | Status: DC

## 2014-11-10 MED ORDER — ADULT MULTIVITAMIN W/MINERALS CH
1.0000 | ORAL_TABLET | Freq: Every day | ORAL | Status: DC
  Administered 2014-11-10 – 2014-11-11 (×2): 1 via ORAL
  Filled 2014-11-10 (×2): qty 1

## 2014-11-10 MED ORDER — SODIUM CHLORIDE 0.9 % IV BOLUS (SEPSIS)
1000.0000 mL | Freq: Once | INTRAVENOUS | Status: AC
  Administered 2014-11-10: 1000 mL via INTRAVENOUS

## 2014-11-10 MED ORDER — LORAZEPAM 1 MG PO TABS: 0.0000 mg | ORAL_TABLET | Freq: Two times a day (BID) | ORAL | Status: DC

## 2014-11-10 MED ORDER — LORAZEPAM 1 MG PO TABS
0.0000 mg | ORAL_TABLET | Freq: Four times a day (QID) | ORAL | Status: DC
  Administered 2014-11-10: 1 mg via ORAL
  Administered 2014-11-10: 2 mg via ORAL
  Administered 2014-11-11: 1 mg via ORAL
  Filled 2014-11-10: qty 2
  Filled 2014-11-10 (×2): qty 1

## 2014-11-10 MED ORDER — HYDROMORPHONE HCL 1 MG/ML IJ SOLN
0.5000 mg | INTRAMUSCULAR | Status: DC | PRN
  Administered 2014-11-10 (×3): 0.5 mg via INTRAVENOUS
  Filled 2014-11-10 (×3): qty 1

## 2014-11-10 MED ORDER — LORAZEPAM 2 MG/ML IJ SOLN
1.0000 mg | Freq: Four times a day (QID) | INTRAMUSCULAR | Status: DC | PRN
Start: 2014-11-10 — End: 2014-11-11

## 2014-11-10 MED ORDER — NICOTINE 21 MG/24HR TD PT24
21.0000 mg | MEDICATED_PATCH | Freq: Every day | TRANSDERMAL | Status: DC
  Administered 2014-11-10 – 2014-11-11 (×2): 21 mg via TRANSDERMAL
  Filled 2014-11-10 (×2): qty 1

## 2014-11-10 MED ORDER — ONDANSETRON HCL 4 MG PO TABS: 4.0000 mg | ORAL_TABLET | Freq: Four times a day (QID) | ORAL | Status: DC | PRN

## 2014-11-10 MED ORDER — LORAZEPAM 1 MG PO TABS
1.0000 mg | ORAL_TABLET | Freq: Four times a day (QID) | ORAL | Status: DC | PRN
  Administered 2014-11-11: 1 mg via ORAL
  Filled 2014-11-10: qty 1

## 2014-11-10 NOTE — Progress Notes (Signed)
[facility] PROGRESS NOTE  Brysten Reister RAQ:[id number] DOB: Mar 12, 1981 DOA: 11/10/2014 PCP: No PCP Per Patient  Brief Summary  Redford Behrle is an 34 y.o. male with hx of polysubstance abuse including alcohol (6 beers plus/day), cocaine, tobacco (about 1ppd), hx of chronic back pain and narcotic dependency, anxiety and depression, presented to the ER with abdominal cramps, bloody stool, nausea, and vomiting. He has had intermittent lose stool for many years. He was previously detoxed, and was sobered for 6 months, but started drinking again. Evaluation in the ER included a UDS which was positive for Opiates, Cocaine, and Benzo. His alcohol level was negative. He had a normal lipase, mild leukocytosis with WBC of 14K, and Hb of 16. His LFTs were normal. An abdominal pelvic CT showed colitis, but otherwise unremarkable. He had no distant travel or ill contacts. Hospitalist was asked to admit him for further work up of his colitis. He had not been on antibiotic recently. He never had a colonoscopy or prior diagnosis of colitis. No family hx of IBD.    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  His ESR was negative. Occult stool was positive. He was seen by gastroenterology who felt that he likely had ischemic colitis secondary to cocaine use. Recommended against initiation of antibiotics at this time. -  Continue observation for diarrhea and symptomatically treatment if needed -  Continue pain control -  Advised against further use of cocaine  Polysubstance abuse including alcohol abuse with relapse, at with for withdrawal -  Telemetry -  CIWA with Ativan per score -  Thiamine, folate, and MVI  Tobacco abuse with withdrawal -  Offered nicotine patch -  Counseled cessation  Hypokalemia, due to poor oral intake, replete with IV potassium.  Diet:  regular Access:  PIV IVF:  yes Proph:  SCDs  Code Status: Full Family Communication: Patient  alone Disposition Plan: Pending improvement in diarrhea   Consultants:  Gastroenterology, Dr.Reyman  Procedures:  CT abdomen and pelvis  Antibiotics:  None   HPI/Subjective:  Still having significant pain in his abdomen, mostly in the left lower quadrant. Denies nausea. No vomiting or diarrhea or bloody stools since admission.  Objective: Filed Vitals:   11/10/14 0541 11/10/14 0600 11/10/14 0659 11/10/14 1434  BP: 151/97 139/86 158/92 127/84  Pulse: 74 66 65 66  Temp: 98.3 F (36.8 C)  98.9 F (37.2 C) 97.8 F (36.6 C)  TempSrc: Oral  Oral Oral  Resp: _0 Height:   6' (1.829 m)   Weight:   89.948 kg (198 lb 4.8 oz)   SpO2: 97% 97% 98% 98%    Intake/Output Summary (Last 24 hours) at 11/10/14 1646 Last data filed at 11/10/14 1300  Gross per 24 hour  Intake   1363 ml  Output      0 ml  Net   1363 ml   Filed Weights   11/09/14 2359 11/10/14 0659  Weight: 92.987 kg (205 lb) 89.948 kg (198 lb 4.8 oz)    Exam:   General:  Sleeping male, appears older than stated age, No acute distress, sleeping but arousable.  HEENT:  NCAT, MMM  Cardiovascular:  RRR, nl S1, S2 no mrg, 2+ pulses, warm extremities  Respiratory:  CTAB, no increased WOB  Abdomen:  Hyperactive, soft, mildly distended, tender to palpation in the left lower quadrant without rebound or guarding  MSK:   Normal tone and bulk, no LEE  Neuro:  Grossly intact  Data  Reviewed: Basic Metabolic Panel:  Recent Labs Lab 11/10/14 0028  NA 136  K 3.4*  CL 102  CO2 28  GLUCOSE 100*  BUN 8  CREATININE 0.95  CALCIUM 8.8*   Liver Function Tests:  Recent Labs Lab 11/10/14 0028  AST 23  ALT 31  ALKPHOS 54  BILITOT 0.5  PROT 7.6  ALBUMIN 4.2    Recent Labs Lab 11/10/14 0028  LIPASE 26   No results for input(s): AMMONIA in the last 168 hours. CBC:  Recent Labs Lab 11/10/14 0028  WBC 14.3*  NEUTROABS 10.8*  HGB 16.1  HCT 46.8  MCV 88.0  PLT 230   Cardiac Enzymes: No  results for input(s): CKTOTAL, CKMB, CKMBINDEX, TROPONINI in the last 168 hours. BNP (last 3 results) No results for input(s): BNP in the last 8760 hours.  ProBNP (last 3 results) No results for input(s): PROBNP in the last 8760 hours.  CBG: No results for input(s): GLUCAP in the last 168 hours.  No results found for this or any previous visit (from the past 240 hour(s)).   Studies: Ct Abdomen Pelvis W Contrast  11/10/2014   CLINICAL DATA:  Lower abdominal pain, onset yesterday morning. Diarrhea. Hematochezia.  EXAM: CT ABDOMEN AND PELVIS WITH CONTRAST  TECHNIQUE: Multidetector CT imaging of the abdomen and pelvis was performed using the standard protocol following bolus administration of intravenous contrast.  CONTRAST:  3m OMNIPAQUE IOHEXOL 300 MG/ML SOLN, 1026mOMNIPAQUE IOHEXOL 300 MG/ML SOLN  COMPARISON:  None.  FINDINGS: There is edema and mural thickening involving the descending colon. There is mild pericolic inflammatory stranding. The findings are most consistent with colitis or ischemia. There is no extraluminal air. There is no abscess. There is no bowel obstruction. Mild colonic diverticulosis is present, but the acute findings do not have the appearance of diverticulitis.  There are normal appearances of the liver, spleen, pancreas, adrenals and kidneys. Gallbladder and bile ducts appear unremarkable.  There is no significant abnormality in the lower chest.  There is no significant musculoskeletal abnormality.  IMPRESSION: Mural thickening and edema of the descending colon with appearances consistent with colitis or ischemia.   Electronically Signed   By: DaAndreas Newport.D.   On: 11/10/2014 02:25    Scheduled Meds: . folic acid  1 mg Oral Daily  . LORazepam  0-4 mg Oral Q6H   Followed by  . [START ON 11/12/2014] LORazepam  0-4 mg Oral Q12H  . multivitamin with minerals  1 tablet Oral Daily  . nicotine  21 mg Transdermal Daily  . sodium chloride  3 mL Intravenous Q12H  .  thiamine  100 mg Oral Daily   Or  . thiamine  100 mg Intravenous Daily   Continuous Infusions: . sodium chloride    . dextrose 5 % and 0.9 % NaCl with KCl 40 mEq/L 125 mL/hr at 11/10/14 1228    Active Problems:   Alcohol abuse   Colitis   Narcotic addiction   Hematochezia    Time spent: 30 min    Priscella Donna, MAHemlockospitalists Pager [phone] 7PM-7AM, please contact night-coverage at [email or web address], password [email or web address]/10/2014, 4:46 PM  LOS: 0 days

## 2014-11-10 NOTE — Progress Notes (Signed)
UR chart review completed.  

## 2014-11-10 NOTE — Care Management Note (Signed)
Case Management Note  Patient Details  Name: Phillip KocherRobert Abbott MRN: [id number] Date of Birth: August 12, 1980  Subjective/Objective:                  Pt admitted from home with abd pain, colitis, etoh abuse. Pt lives with his wife and will return home at discharge. Pt is independent with ADL's. Pt has no insurance or PCP.  Action/Plan: Financial counselor is aware of self pay status. [facility] and pt may need MATCH voucher.  Expected Discharge Date:  11/12/14               Expected Discharge Plan:  Home/Self Care  In-House Referral:  Financial Counselor  Discharge planning Services  CM Consult  Post Acute Care Choice:    Choice offered to:     DME Arranged:    DME Agency:     HH Arranged:    HH Agency:     Status of Service:     Medicare Important Message Given:    Date Medicare IM Given:    Medicare IM give by:    Date Additional Medicare IM Given:    Additional Medicare Important Message give by:     If discussed at Long Length of Stay Meetings, dates discussed:    Additional Comments:  Cheryl FlashBlackwell, Shulamit Donofrio Crowder, RN 11/10/2014, 11:39 AM

## 2014-11-10 NOTE — ED Provider Notes (Signed)
CSN: [id number]     Arrival date & time 11/09/14  2345 History  This chart was scribed for Phillip Albe, MD by Richarda Overlie, ED Scribe. This patient was seen in room APA10/APA10 and the patient's care was started 12:26 AM.     Chief Complaint  Patient presents with  . GI Bleeding   The history is provided by the patient. No language interpreter was used.   HPI Comments: Phillip Abbott is a 34 y.o. male who presents to the Emergency Department complaining of waxing and waning lower abdominal pain that started yesterday morning when he woke up. Pt describes his pain as severe and cramping. Pt reports that he vomited once yesterday as well. He states that he experienced loose and watery diarrhea shortly after the onset of his pain yesterday. He states that since that initial episode of diarrhea he has only passed blood. He reports he has passed blood about 5 times since onset. Pt reports a history of blood in his stools but states his current episode is his most severe. He states he was evaluated at [facility] ED 3 or 4 years ago for blood in his stool and was told he probably had a bleeding ulcer. At that point he was only seen streaks of blood in his stool. He did not have GI follow-up. Pt reports he drinks about a 6 pack daily and smokes 0.75pdd. Pt says he thinks alcohol is a problem for him and says his last rehab was 4 years and states he was sober for 6 months. Pt reports that he takes roxycodone 30-40mg  daily with his last dose being around 12PM today. He states he has not been taking any OTC such as aspirin, ibuprofen or aleve. He denies dizziness, light headedness or blood in his vomit.   PCP none  History reviewed. No pertinent past medical history. Past Surgical History  Procedure Laterality Date  . Tubes inears     History reviewed. No pertinent family history. History  Substance Use Topics  . Smoking status: Current Every Day Smoker -- 1.00 packs/day    Types: Cigarettes  . Smokeless  tobacco: Never Used  . Alcohol Use: 7.2 oz/week    12 Cans of beer per week     Comment: every other day  pt drinks a 6 pp day He smokes 3/4 ppd He takes 30-40 mg of roxycodone daily  Review of Systems  Constitutional: Positive for fever and chills.  Gastrointestinal: Positive for nausea, vomiting, abdominal pain and blood in stool.  Neurological: Negative for dizziness and light-headedness.  All other systems reviewed and are negative.  Allergies  Review of patient's allergies indicates no known allergies.  Home Medications   Prior to Admission medications   Medication Sig Start Date End Date Taking? Authorizing Provider  oxyCODONE-acetaminophen (PERCOCET) 10-325 MG per tablet Take 1 tablet by mouth every 4 (four) hours as needed for pain.    Historical Provider, MD   BP 156/90 mmHg  Pulse 74  Temp(Src) 100 F (37.8 C) (Oral)  Resp 20  Ht 6' (1.829 m)  Wt 205 lb (92.987 kg)  BMI 27.80 kg/m2  SpO2 96%  Vital signs normal except for low-grade fever  Physical Exam  Constitutional: He is oriented to person, place, and time. He appears well-developed and well-nourished.  Non-toxic appearance. He does not appear ill. No distress.  HENT:  Head: Normocephalic and atraumatic.  Right Ear: External ear normal.  Left Ear: External ear normal.  Nose: Nose normal. No mucosal  edema or rhinorrhea.  Mouth/Throat: Oropharynx is clear and moist and mucous membranes are normal. No dental abscesses or uvula swelling.  Eyes: Conjunctivae and EOM are normal. Pupils are equal, round, and reactive to light.  Neck: Normal range of motion and full passive range of motion without pain. Neck supple.  Cardiovascular: Normal rate, regular rhythm and normal heart sounds.  Exam reveals no gallop and no friction rub.   No murmur heard. Pulmonary/Chest: Effort normal and breath sounds normal. No respiratory distress. He has no wheezes. He has no rhonchi. He has no rales. He exhibits no tenderness and no  crepitus.  Abdominal: Soft. Normal appearance and bowel sounds are normal. He exhibits no distension. There is tenderness. There is no rebound and no guarding.    Mild suprapubic tenderness.   Musculoskeletal: Normal range of motion. He exhibits no edema or tenderness.  Moves all extremities well.   Neurological: He is alert and oriented to person, place, and time. He has normal strength. No cranial nerve deficit.  Skin: Skin is warm, dry and intact. No rash noted. No erythema. No pallor.  Psychiatric: He has a normal mood and affect. His speech is normal and behavior is normal. His mood appears not anxious.  Nursing note and vitals reviewed.   ED Course  Procedures    Medications  sodium chloride 0.9 % bolus 1,000 mL (0 mLs Intravenous Stopped 11/10/14 0331)  promethazine (PHENERGAN) injection 12.5 mg (12.5 mg Intravenous Given 11/10/14 0049)  fentaNYL (SUBLIMAZE) injection 50 mcg (50 mcg Intravenous Given 11/10/14 0047)  iohexol (OMNIPAQUE) 300 MG/ML solution 50 mL (50 mLs Oral Contrast Given 11/10/14 0210)  iohexol (OMNIPAQUE) 300 MG/ML solution 100 mL (100 mLs Intravenous Contrast Given 11/10/14 0210)    DIAGNOSTIC STUDIES: Oxygen Saturation is 96% on RA, normal by my interpretation.    COORDINATION OF CARE: 12:36 AM Discussed treatment plan with pt at bedside and pt agreed to plan. Patient was started on IV fluids and given IV pain and nausea medication. We discussed getting a CT scan to get a clearer idea of what is causing his rectal bleeding.  Pt given his test results. He is not sure he wants to be admitted, his mother does, but they are going to discuss it and let me know.   Patient has decided he will be admitted.   04:45 Dr Conley RollsLe will admit the patient.    Labs Review Results for orders placed or performed during the hospital encounter of 11/10/14  Comprehensive metabolic panel  Result Value Ref Range   Sodium 136 135 - 145 mmol/L   Potassium 3.4 (L) 3.5 - 5.1 mmol/L    Chloride 102 101 - 111 mmol/L   CO2 28 22 - 32 mmol/L   Glucose, Bld 100 (H) 70 - 99 mg/dL   BUN 8 6 - 20 mg/dL   Creatinine, Ser 1.610.95 0.61 - 1.24 mg/dL   Calcium 8.8 (L) 8.9 - 10.3 mg/dL   Total Protein 7.6 6.5 - 8.1 g/dL   Albumin 4.2 3.5 - 5.0 g/dL   AST 23 15 - 41 U/L   ALT 31 17 - 63 U/L   Alkaline Phosphatase 54 38 - 126 U/L   Total Bilirubin 0.5 0.3 - 1.2 mg/dL   GFR calc non Af Amer >60 >60 mL/min   GFR calc Af Amer >60 >60 mL/min   Anion gap 6 5 - 15  Lipase, blood  Result Value Ref Range   Lipase 26 22 - 51 U/L  Ethanol  Result Value Ref Range   Alcohol, Ethyl (B) <5 <5 mg/dL  CBC with Differential  Result Value Ref Range   WBC 14.3 (H) 4.0 - 10.5 K/uL   RBC 5.32 4.22 - 5.81 MIL/uL   Hemoglobin 16.1 13.0 - 17.0 g/dL   HCT 16.1 09.6 - 04.5 %   MCV 88.0 78.0 - 100.0 fL   MCH 30.3 26.0 - 34.0 pg   MCHC 34.4 30.0 - 36.0 g/dL   RDW 40.9 81.1 - 91.4 %   Platelets 230 150 - 400 K/uL   Neutrophils Relative % 75 43 - 77 %   Neutro Abs 10.8 (H) 1.7 - 7.7 K/uL   Lymphocytes Relative 12 12 - 46 %   Lymphs Abs 1.7 0.7 - 4.0 K/uL   Monocytes Relative 12 3 - 12 %   Monocytes Absolute 1.7 (H) 0.1 - 1.0 K/uL   Eosinophils Relative 1 0 - 5 %   Eosinophils Absolute 0.1 0.0 - 0.7 K/uL   Basophils Relative 0 0 - 1 %   Basophils Absolute 0.0 0.0 - 0.1 K/uL  Protime-INR  Result Value Ref Range   Prothrombin Time 14.1 11.6 - 15.2 seconds   INR 1.08 0.00 - 1.49  APTT  Result Value Ref Range   aPTT 26 24 - 37 seconds  Urine rapid drug screen (hosp performed)  Result Value Ref Range   Opiates POSITIVE (A) NONE DETECTED   Cocaine POSITIVE (A) NONE DETECTED   Benzodiazepines POSITIVE (A) NONE DETECTED   Amphetamines NONE DETECTED NONE DETECTED   Tetrahydrocannabinol NONE DETECTED NONE DETECTED   Barbiturates NONE DETECTED NONE DETECTED  Urinalysis, Routine w reflex microscopic  Result Value Ref Range   Color, Urine YELLOW YELLOW   APPearance CLEAR CLEAR   Specific Gravity,  Urine 1.020 1.005 - 1.030   pH 7.0 5.0 - 8.0   Glucose, UA NEGATIVE NEGATIVE mg/dL   Hgb urine dipstick MODERATE (A) NEGATIVE   Bilirubin Urine NEGATIVE NEGATIVE   Ketones, ur NEGATIVE NEGATIVE mg/dL   Protein, ur NEGATIVE NEGATIVE mg/dL   Urobilinogen, UA 0.2 0.0 - 1.0 mg/dL   Nitrite NEGATIVE NEGATIVE   Leukocytes, UA NEGATIVE NEGATIVE  Urine microscopic-add on  Result Value Ref Range   Squamous Epithelial / LPF RARE RARE   RBC / HPF 11-20 <3 RBC/hpf   Bacteria, UA FEW (A) RARE  POC occult blood, ED RN will collect  Result Value Ref Range   Fecal Occult Bld POSITIVE (A) NEGATIVE   Laboratory interpretation all normal except positive Hemoccult without being grossly bloody, very positive UDS, leukocytosis    Imaging Review Ct Abdomen Pelvis W Contrast  11/10/2014   CLINICAL DATA:  Lower abdominal pain, onset yesterday morning. Diarrhea. Hematochezia.  EXAM: CT ABDOMEN AND PELVIS WITH CONTRAST  TECHNIQUE: Multidetector CT imaging of the abdomen and pelvis was performed using the standard protocol following bolus administration of intravenous contrast.  CONTRAST:  50mL OMNIPAQUE IOHEXOL 300 MG/ML SOLN, OMNIPAQUE IOHEXOL 300 MG/ML SOLN  COMPARISON:  None.  FINDINGS: There is edema and mural thickening involving the descending colon. There is mild pericolic inflammatory stranding. The findings are most consistent with colitis or ischemia. There is no extraluminal air. There is no abscess. There is no bowel obstruction. Mild colonic diverticulosis is present, but the acute findings do not have the appearance of diverticulitis.  There are normal appearances of the liver, spleen, pancreas, adrenals and kidneys. Gallbladder and bile ducts appear unremarkable.  There is no significant  abnormality in the lower chest.  There is no significant musculoskeletal abnormality.  IMPRESSION: Mural thickening and edema of the descending colon with appearances consistent with colitis or ischemia.    Electronically Signed   By: Ellery Plunkaniel R Mitchell M.D.   On: 11/10/2014 02:25     EKG Interpretation None      MDM   Final diagnoses:  Narcotic abuse  Alcohol abuse  Rectal bleeding  Abdominal pain, unspecified abdominal location  Colitis, acute    Plan admission  Phillip AlbeIva Maxden Naji, MD, FACEP   I personally performed the services described in this documentation, which was scribed in my presence. The recorded information has been reviewed and considered.  Phillip AlbeIva Renella Steig, MD, Concha PyoFACEP      Ladarian Bonczek, MD 11/10/14 [phone]

## 2014-11-10 NOTE — H&P (Signed)
[facility] History and Physical  Porter Moes ZOX:[id number] DOB: 03-13-1981    PCP:   No PCP Per Patient   Chief Complaint: bloody stool and abdominal pain.   HPI: Phillip Abbott is an 33 y.o. male with hx of polysubstance abuse including alcohol (6 beers plus/day), cocaine, tobacco (about 1ppd), hx of chronic back pain and narcotic dependency, anxiety and depression, presented to the ER with abdominal cramps, bloody stool, nausea, and vomiting.  He has had intermittent lose stool for many years.  He was previously detoxed, and was sobered for 6 months, but started drinking again.  Evaluation in the ER included a UDS which was positive for Opiates, Cocaine, and Benzo.  His alcohol level was negative.  He has a normal lipase, mild leukocytosis with WBC of 14K, and Hb of 16.  His LFTs were normal.  An abdominal pelvic CT showed colitis, but otherwise unremarkable. He had no distant travel or ill contacts.  Hospitalist was asked to admit him for further work up of his colitis. He had not been on antibiotic recently.  He never had a colonoscopy or prior diagnosis of colitis.  No family hx of IBD.   Rewiew of Systems:  Constitutional: Negative for malaise, fever and chills. No significant weight loss or weight gain Eyes: Negative for eye pain, redness and discharge, diplopia, visual changes, or flashes of light. ENMT: Negative for ear pain, hoarseness, nasal congestion, sinus pressure and sore throat. No headaches; tinnitus, drooling, or problem swallowing. Cardiovascular: Negative for chest pain, palpitations, diaphoresis, dyspnea and peripheral edema. ; No orthopnea, PND Respiratory: Negative for cough, hemoptysis, wheezing and stridor. No pleuritic chestpain. Gastrointestinal: Negative for  diarrhea, constipation,  melena, hematemesis, jaundice.   Genitourinary: Negative for frequency, dysuria, incontinence,flank pain and hematuria; Musculoskeletal: Negative for back pain and neck pain.  Negative for swelling and trauma.;  Skin: . Negative for pruritus, rash, abrasions, bruising and skin lesion.; ulcerations Neuro: Negative for headache, lightheadedness and neck stiffness. Negative for weakness, altered level of consciousness , altered mental status, extremity weakness, burning feet, involuntary movement, seizure and syncope.  Psych: negative for anxiety, depression, insomnia, tearfulness, panic attacks, hallucinations, paranoia, suicidal or homicidal ideation   History reviewed. No pertinent past medical history.  Past Surgical History  Procedure Laterality Date  . Tubes inears      Medications:  HOME MEDS: Prior to Admission medications   Medication Sig Start Date End Date Taking? Authorizing Provider  oxyCODONE-acetaminophen (PERCOCET) 10-325 MG per tablet Take 1 tablet by mouth every 4 (four) hours as needed for pain.    Historical Provider, MD     Allergies:  No Known Allergies  Social History:   reports that he has been smoking Cigarettes.  He has been smoking about 1.00 pack per day. He has never used smokeless tobacco. He reports that he drinks about 7.2 oz of alcohol per week. He reports that he uses illicit drugs.  Family History: History reviewed. No pertinent family history.   Physical Exam: Filed Vitals:   11/09/14 2359 11/10/14 0342  BP: 156/90 146/95  Pulse: 74 59  Temp: 100 F (37.8 C) 98.8 F (37.1 C)  TempSrc: Oral Oral  Resp: 20 18  Height: 6' (1.829 m)   Weight: 92.987 kg (205 lb)   SpO2: 96% 99%   Blood pressure 146/95, pulse 59, temperature 98.8 F (37.1 C), temperature source Oral, resp. rate 18, height 6' (1.829 m), weight 92.987 kg (205 lb), SpO2 99 %.  GEN:  Pleasant  patient lying in the stretcher in no acute distress; cooperative with exam. PSYCH:  alert and oriented x4; does not appear anxious or depressed; affect is appropriate. HEENT: Mucous membranes pink and anicteric; PERRLA; EOM intact; no cervical lymphadenopathy  nor thyromegaly or carotid bruit; no JVD; There were no stridor. Neck is very supple. Breasts:: Not examined CHEST WALL: No tenderness CHEST: Normal respiration, clear to auscultation bilaterally.  HEART: Regular rate and rhythm.  There are no murmur, rub, or gallops.   BACK: No kyphosis or scoliosis; no CVA tenderness ABDOMEN: soft and tender to palpation.  no masses, no organomegaly, normal abdominal bowel sounds; no pannus; no intertriginous candida. There is no rebound and no distention. Rectal Exam: Not done EXTREMITIES: No bone or joint deformity; age-appropriate arthropathy of the hands and knees; no edema; no ulcerations.  There is no calf tenderness. Genitalia: not examined PULSES: 2+ and symmetric SKIN: Normal hydration no rash or ulceration CNS: Cranial nerves 2-12 grossly intact no focal lateralizing neurologic deficit.  Speech is fluent; uvula elevated with phonation, facial symmetry and tongue midline. DTR are normal bilaterally, cerebella exam is intact, barbinski is negative and strengths are equaled bilaterally.  No sensory loss.   Labs on Admission:  Basic Metabolic Panel:  Recent Labs Lab 11/10/14 0028  NA 136  K 3.4*  CL 102  CO2 28  GLUCOSE 100*  BUN 8  CREATININE 0.95  CALCIUM 8.8*   Liver Function Tests:  Recent Labs Lab 11/10/14 0028  AST 23  ALT 31  ALKPHOS 54  BILITOT 0.5  PROT 7.6  ALBUMIN 4.2    Recent Labs Lab 11/10/14 0028  LIPASE 26   No results for input(s): AMMONIA in the last 168 hours. CBC:  Recent Labs Lab 11/10/14 0028  WBC 14.3*  NEUTROABS 10.8*  HGB 16.1  HCT 46.8  MCV 88.0  PLT 230   Cardiac Enzymes: No results for input(s): CKTOTAL, CKMB, CKMBINDEX, TROPONINI in the last 168 hours.  CBG: No results for input(s): GLUCAP in the last 168 hours.   Radiological Exams on Admission: Ct Abdomen Pelvis W Contrast  11/10/2014   CLINICAL DATA:  Lower abdominal pain, onset yesterday morning. Diarrhea. Hematochezia.   EXAM: CT ABDOMEN AND PELVIS WITH CONTRAST  TECHNIQUE: Multidetector CT imaging of the abdomen and pelvis was performed using the standard protocol following bolus administration of intravenous contrast.  CONTRAST:  50mL OMNIPAQUE IOHEXOL 300 MG/ML SOLN, 100mL OMNIPAQUE IOHEXOL 300 MG/ML SOLN  COMPARISON:  None.  FINDINGS: There is edema and mural thickening involving the descending colon. There is mild pericolic inflammatory stranding. The findings are most consistent with colitis or ischemia. There is no extraluminal air. There is no abscess. There is no bowel obstruction. Mild colonic diverticulosis is present, but the acute findings do not have the appearance of diverticulitis.  There are normal appearances of the liver, spleen, pancreas, adrenals and kidneys. Gallbladder and bile ducts appear unremarkable.  There is no significant abnormality in the lower chest.  There is no significant musculoskeletal abnormality.  IMPRESSION: Mural thickening and edema of the descending colon with appearances consistent with colitis or ischemia.   Electronically Signed   By: Ellery Plunkaniel R Mitchell M.D.   On: 11/10/2014 02:25   Assessment/Plan Present on Admission:  . Alcohol abuse . Colitis . Narcotic addiction . Hematochezia  PLAN:  Will admit him for colitis.  I am concerned about inflammatory bowel disease, although other causes will need to be excluded.  Of the IBDs, there is a  high correlation between smoker and crohn's vs non smoker and UC.  Given his intermittent hx, IBD is a definite possibility.    Will obtain C diff, routine cultures, and O and P.  Will give IVF with K supplement.  Will consult GI for further recommendations. He will be placed on clear liquid.   He is also at risk for alcohol and benzodiapezine withdrawal, and will place him on scheduled Ativan along with Thiamine, folate, and MVI.  He is stable, full code, and [facility]  Thank you for allowing me to participate in his  care.   Other plans as per orders.  Code Status:FULL CODE.    Houston SirenLE,Mercy Leppla, MD. [facility] Pager [phone] 7pm to 7am.  11/10/2014, 5:41 AM

## 2014-11-10 NOTE — ED Notes (Signed)
Patient c/o blood in stools and lower abdominal/super pubic pain. Patient also states NV

## 2014-11-10 NOTE — Progress Notes (Signed)
Patient had a stool this shift but forgot to use the hat to complete the stool and c. Diff sample. Patient re educated and will continue to monitor patient status.

## 2014-11-10 NOTE — Consult Note (Signed)
Reason for Consult:rectal bleeding Referring Physician: Hospitalist  Phillip Abbott is an 34 y.o. male.  HPI: Admitted thru the ED last night. He c/o lower abdominal pain with blood in his stools. Symptoms x 3 days.  He says he was passing clots from his rectum. He says he had a fever last night. He continues to have abdominal pain. Describes pain as a sharp stabbing pain.  Rates pain 9/10.  He has had rectal bleeding on and off for the past 3 yrs. He experiences rectal bleeding at least once a week. His stools are loose no  His stools are formed most of the time. His wife states he has lost about 15 pounds over the past year.  For the most part, his appetite has remained good.  Last cocaine 1 week ago.  He has not been on any recent antibiotics. He drinks 8-10 or more beers a day. He takes on average about 7 Percocet's a day. He smokes a pack of cigarettes a day. Patient in obvious pain this morning.  Family hx of colon cancer in an uncle age 54 (deceased). No family hx of Crohn's disease or UC.     History reviewed. No pertinent past medical history.  Past Surgical History  Procedure Laterality Date  . Tubes inears      History reviewed. No pertinent family history.  Social History:  reports that he has been smoking Cigarettes.  He has been smoking about 1.00 pack per day. He has never used smokeless tobacco. He reports that he drinks about 7.2 oz of alcohol per week. He reports that he uses illicit drugs.  Allergies: No Known Allergies  Medications: I have reviewed the patient's current medications.  Results for orders placed or performed during the hospital encounter of 11/10/14 (from the past 48 hour(s))  Urine rapid drug screen (hosp performed)     Status: Abnormal   Collection Time: 11/10/14 12:20 AM  Result Value Ref Range   Opiates POSITIVE (A) NONE DETECTED   Cocaine POSITIVE (A) NONE DETECTED   Benzodiazepines POSITIVE (A) NONE DETECTED   Amphetamines NONE DETECTED  NONE DETECTED   Tetrahydrocannabinol NONE DETECTED NONE DETECTED   Barbiturates NONE DETECTED NONE DETECTED    Comment:        DRUG SCREEN FOR MEDICAL PURPOSES ONLY.  IF CONFIRMATION IS NEEDED FOR ANY PURPOSE, NOTIFY LAB WITHIN 5 DAYS.        LOWEST DETECTABLE LIMITS FOR URINE DRUG SCREEN Drug Class       Cutoff (ng/mL) Amphetamine      1000 Barbiturate      200 Benzodiazepine   536 Tricyclics       644 Opiates          300 Cocaine          300 THC              50   Urinalysis, Routine w reflex microscopic     Status: Abnormal   Collection Time: 11/10/14 12:20 AM  Result Value Ref Range   Color, Urine YELLOW YELLOW   APPearance CLEAR CLEAR   Specific Gravity, Urine 1.020 1.005 - 1.030   pH 7.0 5.0 - 8.0   Glucose, UA NEGATIVE NEGATIVE mg/dL   Hgb urine dipstick MODERATE (A) NEGATIVE   Bilirubin Urine NEGATIVE NEGATIVE   Ketones, ur NEGATIVE NEGATIVE mg/dL   Protein, ur NEGATIVE NEGATIVE mg/dL   Urobilinogen, UA 0.2 0.0 - 1.0 mg/dL   Nitrite NEGATIVE NEGATIVE   Leukocytes, UA  NEGATIVE NEGATIVE  Urine microscopic-add on     Status: Abnormal   Collection Time: 11/10/14 12:20 AM  Result Value Ref Range   Squamous Epithelial / LPF RARE RARE   RBC / HPF 11-20 <3 RBC/hpf   Bacteria, UA FEW (A) RARE  Comprehensive metabolic panel     Status: Abnormal   Collection Time: 11/10/14 12:28 AM  Result Value Ref Range   Sodium 136 135 - 145 mmol/L   Potassium 3.4 (L) 3.5 - 5.1 mmol/L   Chloride 102 101 - 111 mmol/L   CO2 28 22 - 32 mmol/L   Glucose, Bld 100 (H) 70 - 99 mg/dL   BUN 8 6 - 20 mg/dL   Creatinine, Ser 0.95 0.61 - 1.24 mg/dL   Calcium 8.8 (L) 8.9 - 10.3 mg/dL   Total Protein 7.6 6.5 - 8.1 g/dL   Albumin 4.2 3.5 - 5.0 g/dL   AST 23 15 - 41 U/L   ALT 31 17 - 63 U/L   Alkaline Phosphatase 54 38 - 126 U/L   Total Bilirubin 0.5 0.3 - 1.2 mg/dL   GFR calc non Af Amer >60 >60 mL/min   GFR calc Af Amer >60 >60 mL/min    Comment: (NOTE) The eGFR has been calculated  using the CKD EPI equation. This calculation has not been validated in all clinical situations. eGFR's persistently <90 mL/min signify possible Chronic Kidney Disease.    Anion gap 6 5 - 15  Lipase, blood     Status: None   Collection Time: 11/10/14 12:28 AM  Result Value Ref Range   Lipase 26 22 - 51 U/L  Ethanol     Status: None   Collection Time: 11/10/14 12:28 AM  Result Value Ref Range   Alcohol, Ethyl (B) <5 <5 mg/dL    Comment:        LOWEST DETECTABLE LIMIT FOR SERUM ALCOHOL IS 11 mg/dL FOR MEDICAL PURPOSES ONLY   CBC with Differential     Status: Abnormal   Collection Time: 11/10/14 12:28 AM  Result Value Ref Range   WBC 14.3 (H) 4.0 - 10.5 K/uL   RBC 5.32 4.22 - 5.81 MIL/uL   Hemoglobin 16.1 13.0 - 17.0 g/dL   HCT 46.8 39.0 - 52.0 %   MCV 88.0 78.0 - 100.0 fL   MCH 30.3 26.0 - 34.0 pg   MCHC 34.4 30.0 - 36.0 g/dL   RDW 13.3 11.5 - 15.5 %   Platelets 230 150 - 400 K/uL   Neutrophils Relative % 75 43 - 77 %   Neutro Abs 10.8 (H) 1.7 - 7.7 K/uL   Lymphocytes Relative 12 12 - 46 %   Lymphs Abs 1.7 0.7 - 4.0 K/uL   Monocytes Relative 12 3 - 12 %   Monocytes Absolute 1.7 (H) 0.1 - 1.0 K/uL   Eosinophils Relative 1 0 - 5 %   Eosinophils Absolute 0.1 0.0 - 0.7 K/uL   Basophils Relative 0 0 - 1 %   Basophils Absolute 0.0 0.0 - 0.1 K/uL  Protime-INR     Status: None   Collection Time: 11/10/14 12:28 AM  Result Value Ref Range   Prothrombin Time 14.1 11.6 - 15.2 seconds   INR 1.08 0.00 - 1.49  APTT     Status: None   Collection Time: 11/10/14 12:28 AM  Result Value Ref Range   aPTT 26 24 - 37 seconds  POC occult blood, ED RN will collect     Status: Abnormal  Collection Time: 11/10/14 12:41 AM  Result Value Ref Range   Fecal Occult Bld POSITIVE (A) NEGATIVE    Ct Abdomen Pelvis W Contrast  11/10/2014   CLINICAL DATA:  Lower abdominal pain, onset yesterday morning. Diarrhea. Hematochezia.  EXAM: CT ABDOMEN AND PELVIS WITH CONTRAST  TECHNIQUE: Multidetector CT  imaging of the abdomen and pelvis was performed using the standard protocol following bolus administration of intravenous contrast.  CONTRAST:  36m OMNIPAQUE IOHEXOL 300 MG/ML SOLN, 104mOMNIPAQUE IOHEXOL 300 MG/ML SOLN  COMPARISON:  None.  FINDINGS: There is edema and mural thickening involving the descending colon. There is mild pericolic inflammatory stranding. The findings are most consistent with colitis or ischemia. There is no extraluminal air. There is no abscess. There is no bowel obstruction. Mild colonic diverticulosis is present, but the acute findings do not have the appearance of diverticulitis.  There are normal appearances of the liver, spleen, pancreas, adrenals and kidneys. Gallbladder and bile ducts appear unremarkable.  There is no significant abnormality in the lower chest.  There is no significant musculoskeletal abnormality.  IMPRESSION: Mural thickening and edema of the descending colon with appearances consistent with colitis or ischemia.   Electronically Signed   By: DaAndreas Newport.D.   On: 11/10/2014 02:25    ROS Blood pressure 158/92, pulse 65, temperature 98.9 F (37.2 C), temperature source Oral, resp. rate 20, height 6' (1.829 m), weight 198 lb 4.8 oz (89.948 kg), SpO2 98 %. Physical Exam Alert and oriented. Skin warm and dry. Oral mucosa is moist.   . Sclera anicteric, conjunctivae is pink. Thyroid not enlarged. No cervical lymphadenopathy. Lungs clear. Heart regular rate and rhythm.  Abdomen is soft. Bowel sounds are positive. No hepatomegaly. No abdominal masses felt. Diffuse abdominal tenderness with guarding.   No edema to lower extremities.    Assessment/Plan: Rectal bleeding. Colitis. Stool studies are pending. No recent antibiotics. He has maintained his Hemoglobin. I will discuss with Dr. ReLaural Golden  SETZER,TERRI W 11/10/2014, 8:26 AM     GI attending note; Patient is sound asleep and unable to provide history which is as above. Lab studies and  abdominopelvic CT reviewed. Abdominal exam reveals soft abdomen without guarding or organomegaly. Stool studies are pending. Patient has acute left-sided colitis either due to cocaine or an infection. Patient does not appear to be acutely ill and therefore would not recommend empiric antibiotic therapy. Will check CBC in a.m.

## 2014-11-11 DIAGNOSIS — F1110 Opioid abuse, uncomplicated: Secondary | ICD-10-CM

## 2014-11-11 DIAGNOSIS — F1410 Cocaine abuse, uncomplicated: Secondary | ICD-10-CM

## 2014-11-11 DIAGNOSIS — K529 Noninfective gastroenteritis and colitis, unspecified: Secondary | ICD-10-CM

## 2014-11-11 DIAGNOSIS — K625 Hemorrhage of anus and rectum: Secondary | ICD-10-CM

## 2014-11-11 DIAGNOSIS — F1010 Alcohol abuse, uncomplicated: Secondary | ICD-10-CM

## 2014-11-11 DIAGNOSIS — R109 Unspecified abdominal pain: Secondary | ICD-10-CM

## 2014-11-11 LAB — COMPREHENSIVE METABOLIC PANEL
ALT: 29 U/L (ref 17–63)
AST: 22 U/L (ref 15–41)
Albumin: 3.5 g/dL (ref 3.5–5.0)
Alkaline Phosphatase: 42 U/L (ref 38–126)
Anion gap: 8 (ref 5–15)
BUN: 11 mg/dL (ref 6–20)
CO2: 28 mmol/L (ref 22–32)
Calcium: 8.9 mg/dL (ref 8.9–10.3)
Chloride: 105 mmol/L (ref 101–111)
Creatinine, Ser: 0.93 mg/dL (ref 0.61–1.24)
GFR calc Af Amer: >60 mL/min (ref >60)
GFR calc non Af Amer: >60 mL/min (ref >60)
Glucose, Bld: 104 mg/dL — ABNORMAL HIGH (ref 70–99)
Potassium: 3.7 mmol/L (ref 3.5–5.1)
Sodium: 141 mmol/L (ref 135–145)
Total Bilirubin: 0.5 mg/dL (ref 0.3–1.2)
Total Protein: 6.4 g/dL — ABNORMAL LOW (ref 6.5–8.1)

## 2014-11-11 LAB — CBC
HCT: 43 % (ref 39.0–52.0)
Hemoglobin: 14.2 g/dL (ref 13.0–17.0)
MCH: 29.8 pg (ref 26.0–34.0)
MCHC: 33 g/dL (ref 30.0–36.0)
MCV: 90.3 fL (ref 78.0–100.0)
Platelets: 227 10*3/uL (ref 150–400)
RBC: 4.76 MIL/uL (ref 4.22–5.81)
RDW: 13.4 % (ref 11.5–15.5)
WBC: 9.5 10*3/uL (ref 4.0–10.5)

## 2014-11-11 LAB — CLOSTRIDIUM DIFFICILE BY PCR: Toxigenic C. Difficile by PCR: NEGATIVE

## 2014-11-11 NOTE — Discharge Summary (Signed)
Physician Discharge Summary  Phillip Abbott IFO:[id number] DOB: 1980-09-07 DOA: 11/10/2014  PCP: No PCP Per Patient  Admit date: 11/10/2014 Discharge date: 11/11/2014  Recommendations for Outpatient Follow-up:  1. F/u with gastroenterology in 4 months 2. PCP in 1-2 weeks   Discharge Diagnoses:  Principal Problem:   Colitis Active Problems:   Alcohol abuse   Narcotic addiction   Hematochezia   Cocaine abuse   Discharge Condition: stable, improved  Diet recommendation: low residue  Wt Readings from Last 3 Encounters:  11/10/14 89.948 kg (198 lb 4.8 oz)  05/19/14 86.183 kg (190 lb)  01/25/13 93.441 kg (206 lb)    History of present illness:  Phillip Abbott is an 34 y.o. male with hx of polysubstance abuse including alcohol (6 beers plus/day), cocaine, tobacco (about 1ppd), hx of chronic back pain and narcotic dependency, anxiety and depression, presented to the ER with abdominal cramps, bloody stool, nausea, and vomiting. He has had intermittent lose stool for many years. He was previously detoxed, and was sobered for 6 months, but started drinking again. Evaluation in the ER included a UDS which was positive for Opiates, Cocaine, and Benzo. His alcohol level was negative. He had a normal lipase, mild leukocytosis with WBC of 14K, and Hb of 16. His LFTs were normal. An abdominal pelvic CT showed colitis, but otherwise unremarkable. He had no distant travel or ill contacts.He had not been on antibiotic recently. He never had a colonoscopy or prior diagnosis of colitis. No family hx of IBD. He was evaluated by gastroenterology who felt he likely had ischemic colitis from cocaine use.  He was given a clear liquid diet and pain control with primarily oral pain medication.  He sneaked solid foods, however, so his diet was changed to regular and he seemed to tolerate regular foods without experiencing worsening pain.     Hospital Course:   Likely ischemic colitis secondary to cocaine  use.  -  His ESR was negative.  -  Occult stool was positive -  C. Diff negative -  O&P and stool culture are pending -   He was seen by gastroenterology who recommended against initiation of antibiotics at this time.  - Advised against further use of cocaine - Will need GI follow up as outpatient to review results of additional tests -  He was advised to use his previous oxycodone-APAP 10-343m tabs at home for pain until he is able to follow up with his primary care doctor or gastroenterologist  Polysubstance abuse including alcohol abuse with relapse, at with for withdrawal - Telemetry: Sinus brady in 42s- CIWA with Ativan per score - Thiamine, folate, and MVI  Tobacco abuse with withdrawal - Offered nicotine patch - Counseled cessation  Hypokalemia, due to poor oral intake, repleted with IV potassium, resolved  Leukocytosis due to drug use and colitis, resolved.  Consultants:  Gastroenterology, Dr.Reyman  Procedures:  CT abdomen and pelvis  Antibiotics:  None  Discharge Exam: Filed Vitals:   11/11/14 1445  BP: 136/70  Pulse: 57  Temp: 97.6 F (36.4 C)  Resp: 20   Filed Vitals:   11/10/14 2236 11/11/14 0201 11/11/14 0515 11/11/14 1445  BP: 137/91 140/91 121/78 136/70  Pulse: 73 85 73 57  Temp:  98.4 F (36.9 C) 98.7 F (37.1 C) 97.6 F (36.4 C)  TempSrc:  Oral Oral Oral  Resp:  '20 20 20  ' Height:      Weight:      SpO2: 100% 100% 100% 99%  General: Adult male, appears older than stated age, No acute distress  HEENT: NCAT, MMM  Cardiovascular: RRR, nl S1, S2 no mrg, 2+ pulses, warm extremities  Respiratory: CTAB, no increased WOB  Abdomen: Hyperactive, soft, mildly distended, tender to palpation in the left lower quadrant without rebound or guarding  MSK: Normal tone and bulk, no LEE  Neuro: Grossly intact  Discharge Instructions      Discharge Instructions    Call MD for:  difficulty breathing, headache or visual  disturbances    Complete by:  As directed      Call MD for:  extreme fatigue    Complete by:  As directed      Call MD for:  hives    Complete by:  As directed      Call MD for:  persistant dizziness or light-headedness    Complete by:  As directed      Call MD for:  persistant nausea and vomiting    Complete by:  As directed      Call MD for:  severe uncontrolled pain    Complete by:  As directed      Call MD for:  temperature >100.4    Complete by:  As directed      Diet general    Complete by:  As directed      Discharge instructions    Complete by:  As directed   You were hospitalized with inflammation of your colon probably due to cocaine use although we are testing you for infectious causes of colitis also.  Cocaine causes blood vessels to clamp down all over the body which causes heart attacks, strokes, kidney failure, and even dead intestines (which is essentially what you almost had).  Please follow up with gastroenterology in about 4 months to review your test results and determine if you may need a colonoscopy to make sure you do not have anything else causing your symptoms.  Please eat a low residue diet for the next week.     Increase activity slowly    Complete by:  As directed             Medication List    TAKE these medications        oxyCODONE-acetaminophen 10-325 MG per tablet  Commonly known as:  PERCOCET  Take 1 tablet by mouth every 4 (four) hours as needed for pain.       Follow-up Information    Follow up with [facility] On 02/10/1323.   Specialty:  Occupational Therapy   Why:  at 8:30   Contact information:   [address] [place] [address] [phone]       Follow up with REHMAN,NAJEEB U, MD. Schedule an appointment as soon as possible for a visit in 4 months.   Specialty:  Gastroenterology   Contact information:   [place], [address] [place]  [address] [phone]        The results of  significant diagnostics from this hospitalization (including imaging, microbiology, ancillary and laboratory) are listed below for reference.    Significant Diagnostic Studies: Ct Abdomen Pelvis W Contrast  11/10/2014   CLINICAL DATA:  Lower abdominal pain, onset yesterday morning. Diarrhea. Hematochezia.  EXAM: CT ABDOMEN AND PELVIS WITH CONTRAST  TECHNIQUE: Multidetector CT imaging of the abdomen and pelvis was performed using the standard protocol following bolus administration of intravenous contrast.  CONTRAST:  28m OMNIPAQUE IOHEXOL 300 MG/ML SOLN, 1045m  OMNIPAQUE IOHEXOL 300 MG/ML SOLN  COMPARISON:  None.  FINDINGS: There is edema and mural thickening involving the descending colon. There is mild pericolic inflammatory stranding. The findings are most consistent with colitis or ischemia. There is no extraluminal air. There is no abscess. There is no bowel obstruction. Mild colonic diverticulosis is present, but the acute findings do not have the appearance of diverticulitis.  There are normal appearances of the liver, spleen, pancreas, adrenals and kidneys. Gallbladder and bile ducts appear unremarkable.  There is no significant abnormality in the lower chest.  There is no significant musculoskeletal abnormality.  IMPRESSION: Mural thickening and edema of the descending colon with appearances consistent with colitis or ischemia.   Electronically Signed   By: Andreas Newport M.D.   On: 11/10/2014 02:25    Microbiology: Recent Results (from the past 240 hour(s))  Clostridium Difficile by PCR     Status: None   Collection Time: 11/11/14 12:45 AM  Result Value Ref Range Status   C difficile by pcr NEGATIVE NEGATIVE Final  Stool culture     Status: None (Preliminary result)   Collection Time: 11/11/14 12:45 AM  Result Value Ref Range Status   Specimen Description STOOL  Final   Special Requests IMMUNE:COMPROMISED  Final   Culture PENDING  Incomplete   Report Status PENDING  Incomplete  Ova  and parasite examination     Status: None (Preliminary result)   Collection Time: 11/11/14 12:45 AM  Result Value Ref Range Status   Specimen Description STOOL  Final   Special Requests IMMUNE:COMPROMISED  Final   Ova and parasites PENDING  Incomplete   Report Status PENDING  Incomplete     Labs: Basic Metabolic Panel:  Recent Labs Lab 11/10/14 0028 11/11/14 0547  NA 136 141  K 3.4* 3.7  CL 102 105  CO2 28 28  GLUCOSE 100* 104*  BUN 8 11  CREATININE 0.95 0.93  CALCIUM 8.8* 8.9   Liver Function Tests:  Recent Labs Lab 11/10/14 0028 11/11/14 0547  AST 23 22  ALT 31 29  ALKPHOS 54 42  BILITOT 0.5 0.5  PROT 7.6 6.4*  ALBUMIN 4.2 3.5    Recent Labs Lab 11/10/14 0028  LIPASE 26   No results for input(s): AMMONIA in the last 168 hours. CBC:  Recent Labs Lab 11/10/14 0028 11/11/14 0547  WBC 14.3* 9.5  NEUTROABS 10.8*  --   HGB 16.1 14.2  HCT 46.8 43.0  MCV 88.0 90.3  PLT 230 227   Cardiac Enzymes: No results for input(s): CKTOTAL, CKMB, CKMBINDEX, TROPONINI in the last 168 hours. BNP: BNP (last 3 results) No results for input(s): BNP in the last 8760 hours.  ProBNP (last 3 results) No results for input(s): PROBNP in the last 8760 hours.  CBG: No results for input(s): GLUCAP in the last 168 hours.  Time coordinating discharge: 35 minutes  Signed:  Sanaia Jasso  [facility] 11/11/2014, 5:09 PM

## 2014-11-11 NOTE — Progress Notes (Signed)
[facility] PROGRESS NOTE  Phillip Abbott XFG:[id number] DOB: 08-Jun-1981 DOA: 11/10/2014 PCP: No PCP Per Patient  Brief Summary  Phillip Abbott is an 34 y.o. male with hx of polysubstance abuse including alcohol (6 beers plus/day), cocaine, tobacco (about 1ppd), hx of chronic back pain and narcotic dependency, anxiety and depression, presented to the ER with abdominal cramps, bloody stool, nausea, and vomiting. He has had intermittent lose stool for many years. He was previously detoxed, and was sobered for 6 months, but started drinking again. Evaluation in the ER included a UDS which was positive for Opiates, Cocaine, and Benzo. His alcohol level was negative. He had a normal lipase, mild leukocytosis with WBC of 14K, and Hb of 16. His LFTs were normal. An abdominal pelvic CT showed colitis, but otherwise unremarkable. He had no distant travel or ill contacts. Hospitalist was asked to admit him for further work up of his colitis. He had not been on antibiotic recently. He never had a colonoscopy or prior diagnosis of colitis. No family hx of IBD.    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  His ESR was negative. Occult stool was positive. He was seen by gastroenterology who felt that he likely had ischemic colitis secondary to cocaine use. Recommended against initiation of antibiotics at this time.   -  Patient had his family sneak in a burger last night because he was hungry which he tolerated without vomiting and his abdominal pain is about the same today -  Advance from CLD to regular diet and if tolerating, okay to discharge to home this afternoon -  D/c IVF -  Continue pain control -  Advised against further use of cocaine -  Will need GI follow up as outpatient  Polysubstance abuse including alcohol abuse with relapse, at with for withdrawal -  Telemetry:  Sinus brady in 27s -  CIWA with Ativan per score -  Thiamine, folate, and MVI  Tobacco  abuse with withdrawal -  Offered nicotine patch -  Counseled cessation  Hypokalemia, due to poor oral intake, repleted with IV potassium, resolved  Leukocytosis due to drug use and colitis, resolved.  Diet:  regular Access:  PIV IVF:  off Proph:  SCDs  Code Status: Full Family Communication: Patient alone Disposition Plan: Possibly home this afternoon if tolerating diet   Consultants:  Gastroenterology, Dr.Reyman  Procedures:  CT abdomen and pelvis  Antibiotics:  None   HPI/Subjective:  States he was hungry last night. His family sneak in a burger which he ate without problem. He states his abdominal pain is about an 8 out of 10, comes and goes and last about 8 minutes at a time. His pain is not worse despite eating is burger. He had 3 episodes of applesauce-like diarrhea mixed with burgundy blood overnight.  Objective: Filed Vitals:   11/10/14 2143 11/10/14 2236 11/11/14 0201 11/11/14 0515  BP: 147/100 137/91 140/91 121/78  Pulse: 84 73 85 73  Temp: 97.7 F (36.5 C)  98.4 F (36.9 C) 98.7 F (37.1 C)  TempSrc: Oral  Oral Oral  Resp: _0 Height:      Weight:      SpO2: 100% 100% 100% 100%    Intake/Output Summary (Last 24 hours) at 11/11/14 0823 Last data filed at 11/10/14 1800  Gross per 24 hour  Intake 1054.67 ml  Output      0 ml  Net 1054.67 ml   Filed Weights   11/09/14 2359  11/10/14 0659  Weight: 92.987 kg (205 lb) 89.948 kg (198 lb 4.8 oz)    Exam:   General:  Adult male, appears older than stated age, No acute distress  HEENT:  NCAT, MMM  Cardiovascular:  RRR, nl S1, S2 no mrg, 2+ pulses, warm extremities  Respiratory:  CTAB, no increased WOB  Abdomen:  Hyperactive, soft, mildly distended, tender to palpation in the left lower quadrant without rebound or guarding  MSK:   Normal tone and bulk, no LEE  Neuro:  Grossly intact  Data Reviewed: Basic Metabolic Panel:  Recent Labs Lab 11/10/14 0028 11/11/14 0547  NA 136 141   K 3.4* 3.7  CL 102 105  CO2 28 28  GLUCOSE 100* 104*  BUN 8 11  CREATININE 0.95 0.93  CALCIUM 8.8* 8.9   Liver Function Tests:  Recent Labs Lab 11/10/14 0028 11/11/14 0547  AST 23 22  ALT 31 29  ALKPHOS 54 42  BILITOT 0.5 0.5  PROT 7.6 6.4*  ALBUMIN 4.2 3.5    Recent Labs Lab 11/10/14 0028  LIPASE 26   No results for input(s): AMMONIA in the last 168 hours. CBC:  Recent Labs Lab 11/10/14 0028 11/11/14 0547  WBC 14.3* 9.5  NEUTROABS 10.8*  --   HGB 16.1 14.2  HCT 46.8 43.0  MCV 88.0 90.3  PLT 230 227   Cardiac Enzymes: No results for input(s): CKTOTAL, CKMB, CKMBINDEX, TROPONINI in the last 168 hours. BNP (last 3 results) No results for input(s): BNP in the last 8760 hours.  ProBNP (last 3 results) No results for input(s): PROBNP in the last 8760 hours.  CBG: No results for input(s): GLUCAP in the last 168 hours.  Recent Results (from the past 240 hour(s))  Clostridium Difficile by PCR     Status: None   Collection Time: 11/11/14 12:45 AM  Result Value Ref Range Status   C difficile by pcr NEGATIVE NEGATIVE Final     Studies: Ct Abdomen Pelvis W Contrast  11/10/2014   CLINICAL DATA:  Lower abdominal pain, onset yesterday morning. Diarrhea. Hematochezia.  EXAM: CT ABDOMEN AND PELVIS WITH CONTRAST  TECHNIQUE: Multidetector CT imaging of the abdomen and pelvis was performed using the standard protocol following bolus administration of intravenous contrast.  CONTRAST:  32m OMNIPAQUE IOHEXOL 300 MG/ML SOLN, 1044mOMNIPAQUE IOHEXOL 300 MG/ML SOLN  COMPARISON:  None.  FINDINGS: There is edema and mural thickening involving the descending colon. There is mild pericolic inflammatory stranding. The findings are most consistent with colitis or ischemia. There is no extraluminal air. There is no abscess. There is no bowel obstruction. Mild colonic diverticulosis is present, but the acute findings do not have the appearance of diverticulitis.  There are normal  appearances of the liver, spleen, pancreas, adrenals and kidneys. Gallbladder and bile ducts appear unremarkable.  There is no significant abnormality in the lower chest.  There is no significant musculoskeletal abnormality.  IMPRESSION: Mural thickening and edema of the descending colon with appearances consistent with colitis or ischemia.   Electronically Signed   By: DaAndreas Newport.D.   On: 11/10/2014 02:25    Scheduled Meds: . folic acid  1 mg Oral Daily  . LORazepam  0-4 mg Oral Q6H   Followed by  . [START ON 11/12/2014] LORazepam  0-4 mg Oral Q12H  . multivitamin with minerals  1 tablet Oral Daily  . nicotine  21 mg Transdermal Daily  . sodium chloride  3 mL Intravenous Q12H  . thiamine  100 mg Oral Daily   Or  . thiamine  100 mg Intravenous Daily   Continuous Infusions: . sodium chloride      Active Problems:   Alcohol abuse   Colitis   Narcotic addiction   Hematochezia    Time spent: 30 min    Mell Guia, [facility] Pager [phone]. If 7PM-7AM, please contact night-coverage at [email or web address], password [facility] 11/11/2014, 8:23 AM  LOS: 1 day

## 2014-11-11 NOTE — Care Management Note (Signed)
Case Management Note  Patient Details  Name: Rene KocherRobert Flythe MRN: [id number] Date of Birth: 1980/11/12  Subjective/Objective:                    Action/Plan:   Expected Discharge Date:  11/12/14               Expected Discharge Plan:  Home/Self Care  In-House Referral:  Financial Counselor  Discharge planning Services  CM Consult  Post Acute Care Choice:  NA Choice offered to:  NA  DME Arranged:    DME Agency:     HH Arranged:    HH Agency:     Status of Service:  Completed, signed off  Medicare Important Message Given:    Date Medicare IM Given:    Medicare IM give by:    Date Additional Medicare IM Given:    Additional Medicare Important Message give by:     If discussed at Long Length of Stay Meetings, dates discussed:    Additional Comments: Anticipate discharge later today. [facility]. Pt also made aware. No other Cm needs noted. Arlyss QueenBlackwell, Denean Pavon Woodfieldrowder, RN 11/11/2014, 4:16 PM

## 2014-11-11 NOTE — Progress Notes (Signed)
Patient ID: Rene KocherRobert Petralia, male   DOB: 29-Jan-1981, 34 y.o.   MRN: [id number] Feels 10% better. Continues to have some crampy abdominal pain. Stool specimen obtained last night. He reports it was bloody. Rates pain 7/10 today.  Hx of polysubstance abuse. Appetite has remained good. No nausea or vomiting. Receiving po pain med every 4 hrs.  CBC    Component Value Date/Time   WBC 9.5 11/11/2014 0547   RBC 4.76 11/11/2014 0547   HGB 14.2 11/11/2014 0547   HCT 43.0 11/11/2014 0547   PLT 227 11/11/2014 0547   MCV 90.3 11/11/2014 0547   MCH 29.8 11/11/2014 0547   MCHC 33.0 11/11/2014 0547   RDW 13.4 11/11/2014 0547   LYMPHSABS 1.7 11/10/2014 0028   MONOABS 1.7* 11/10/2014 0028   EOSABS 0.1 11/10/2014 0028   BASOSABS 0.0 11/10/2014 0028     Filed Vitals:   11/11/14 0515  BP: 121/78  Pulse: 73  Temp: 98.7 F (37.1 C)  Resp: 20  Probable ischemic colitis from cocaine. Plan: Stool studies pending.

## 2014-11-11 NOTE — Progress Notes (Signed)
Patient's condition discuss with patient's mother who is at bedside. Patient had no objection for her to be present in room. His mother states that her brother had colon cancer at 5231 and she just wants to make sure Molly MaduroRobert does not have colon carcinoma. Stool C. Difficile by PCR is negative. Stool culture and O&P not complete. WBC is 9.5. Abdominal exam reveals soft abdomen without tenderness or guarding. Patient has taken oxycodone by mouth 3 times in the last 16 hours.  Acute colitis most likely secondary to cocaine. Will need to follow-up on stool studies to make sure we not dealing with infection. Patient's mother reassured that his presentation and CT findings are not consistent with CRC. Patient is tolerating diet. Patient advised to stay on low residue diet for rest of the week. Will plan to see him in the office in 4 months. My office will call patient is scheduled visit.

## 2014-11-11 NOTE — Clinical Social Work Note (Signed)
Clinical Social Work Assessment  Patient Details  Name: Phillip Abbott MRN: [id number] Date of Birth: Feb 10, 1981  Date of referral:  11/11/14               Reason for consult:  Substance Use/ETOH Abuse                Permission sought to share information with:    Permission granted to share information::     Name::        Agency::     Relationship::     Contact Information:     Housing/Transportation Living arrangements for the past 2 months:  Single Family Home Source of Information:  Patient Patient Interpreter Needed:  None Criminal Activity/Legal Involvement Pertinent to Current Situation/Hospitalization:    Significant Relationships:  Significant Other, Parents Lives with:  Significant Other Do you feel safe going back to the place where you live?  Yes Need for family participation in patient care:   (Family needs to continue providing emotional support to patient. )  Care giving concerns: none identified   Facilities manager / plan:  CSW met with patient who was alert and oriented.  Patient's girlfriend, Lawerance Bach, was asleep in the chair in patient's room.  Patient gave CSW permission to discuss his issues in her presence. CSW discussed the purpose of the referral was to address alcohol/substance abuse issues. Patient that he and his girlfriend live together. He stated that he ambulates unassisted and completes his ADLs unassisted.  CSW completed that SBIRT screening with patient.  CSW provided patient with area MH/SA resource guide and AA meeting list.  CSW provided patient with fact sheets on the effects of alcohol, cocaine and benzodiazepines.  Patient indicated that he had been in SA treatment about two or three years ago at [facility] in [place], [place].  He indicated that he was not currently in SA treatment nor did he have a [facility] provider.  He indicated that he was interested in seeking SA treatment.  CSW offered to schedule appointments for SA treatment for patient with any  provider that he chose.  Patient indicated that he did not desire CSW to schedule the appointment and that he would schedule the appointment independently.   CSW signing off.   Employment status:  Unemployed (Patient indicated that while he is currently unemployed, his step-father advised him that when he was well enough he could come back to work at the [employer] information:  Self Pay (Medicaid Pending) PT Recommendations:    Information / Referral to community resources:  Outpatient Substance Abuse Treatment Options  Patient/Family's Response to care:  Patient was receptive to [facility] referral information and education provided.  Patient/Family's Understanding of and Emotional Response to Diagnosis, Current Treatment, and Prognosis:  Patient verbalized understanding of his current diagnosis, treatment and prognosis.  He indicated that he is will schedule SA treatment for himself.    Emotional Assessment Appearance:  Developmentally appropriate Attitude/Demeanor/Rapport:   (Cooperative) Affect (typically observed):  Accepting, Calm Orientation:  Oriented to Self, Oriented to Place, Oriented to  Time, Oriented to Situation Alcohol / Substance use:  Alcohol Use, Illicit Drugs, Tobacco Use Psych involvement (Current and /or in the community):  No (Comment)  Discharge Needs  Concerns to be addressed:  Substance Abuse Concerns Readmission within the last 30 days:  No Current discharge risk:  Substance Abuse Barriers to Discharge:  No Barriers Identified   Ihor Gully, LCSW 11/11/2014, 1:08 PM

## 2014-11-12 LAB — OVA AND PARASITE EXAMINATION: Ova and parasites: NONE SEEN

## 2014-11-15 LAB — STOOL CULTURE

## 2015-10-13 IMAGING — CT CT ABD-PELV W/ CM
2 of 4 series · 17 of 46 positions shown, 19 images · IV contrast (Omnipaque 300)
Comparison: None.

CLINICAL DATA: Lower abdominal pain, onset yesterday morning.
Diarrhea. Hematochezia.

EXAM:
CT ABDOMEN AND PELVIS WITH CONTRAST
TECHNIQUE: Multidetector CT imaging of the abdomen and pelvis was performed
using the standard protocol following bolus administration of
intravenous contrast.
CONTRAST:  50mL OMNIPAQUE IOHEXOL 300 MG/ML SOLN, 100mL OMNIPAQUE
IOHEXOL 300 MG/ML SOLN

[Series 2: abd_pel_with 5.0 b40f · axial · 0.76mm/px · z∈[-544,-90]mm · 14 of 101 slices shown, 16 images]
[im 5/101  soft-tissue]
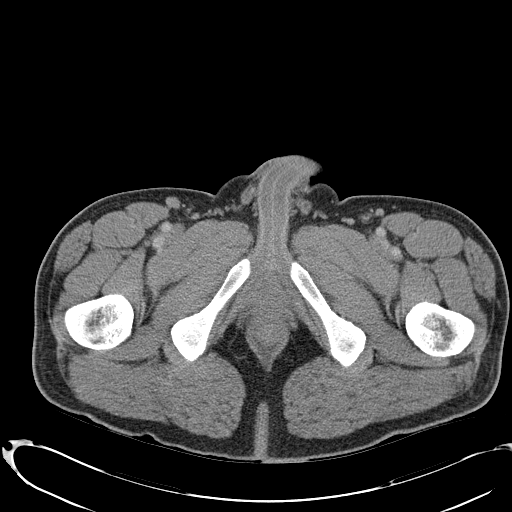
[im 5/101  bone]
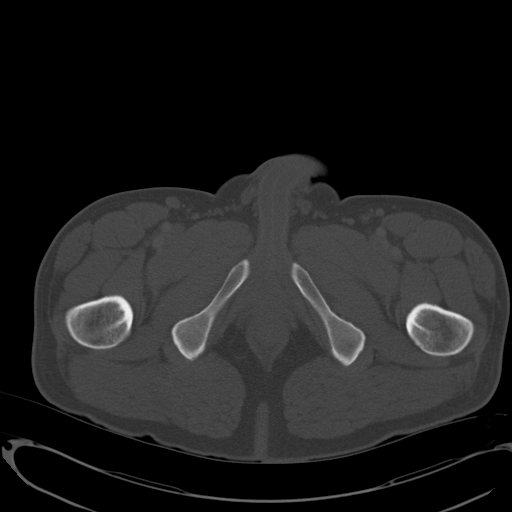
[im 14/101  soft-tissue]
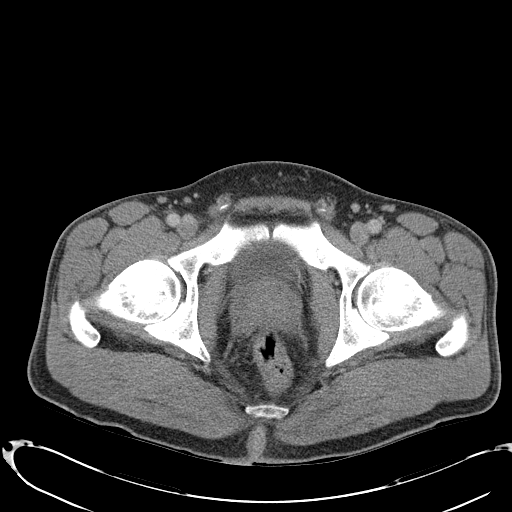
[im 19/101  soft-tissue]
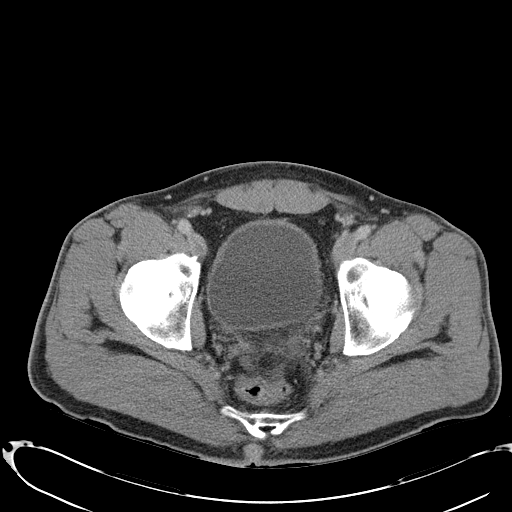
[im 28/101  soft-tissue]
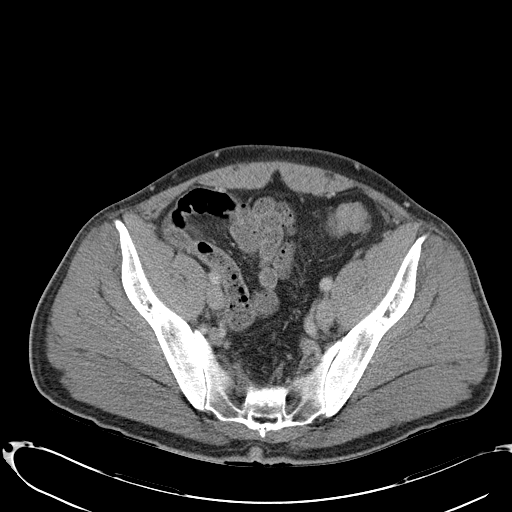
[im 32/101  soft-tissue]
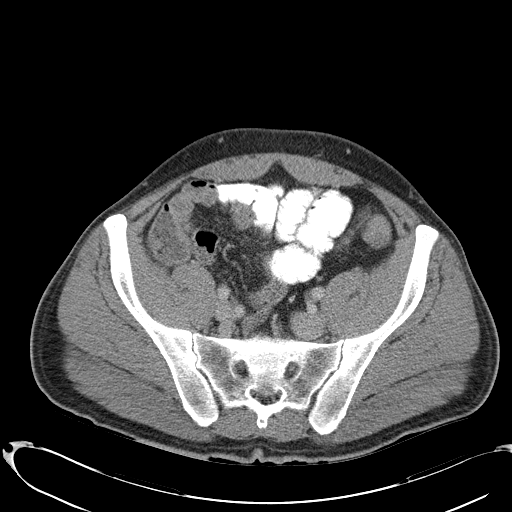
[im 41/101  soft-tissue]
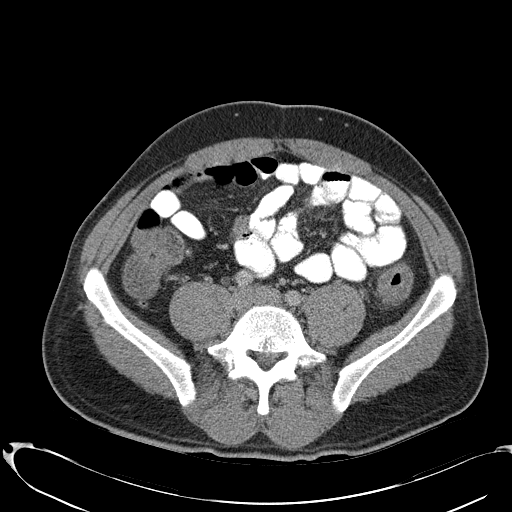
[im 46/101  soft-tissue]
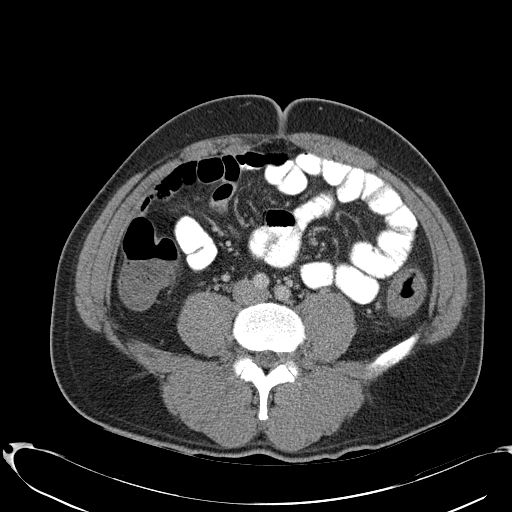
[im 55/101  soft-tissue]
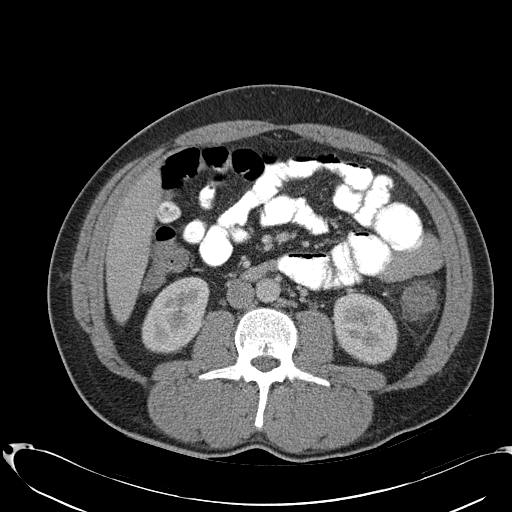
[im 60/101  soft-tissue]
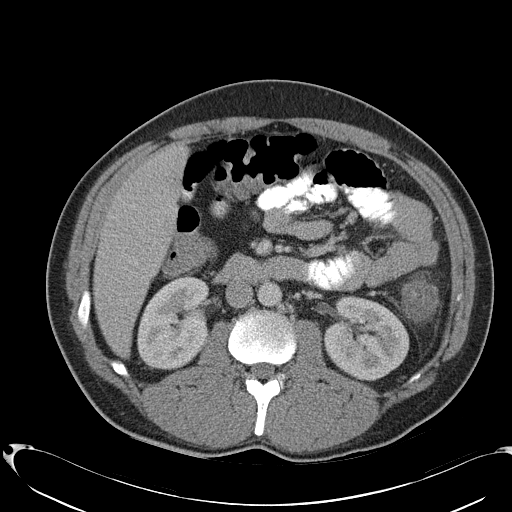
[im 60/101  bone]
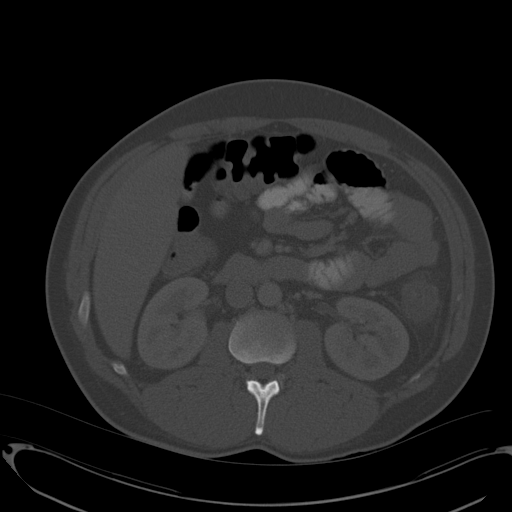
[im 69/101  soft-tissue]
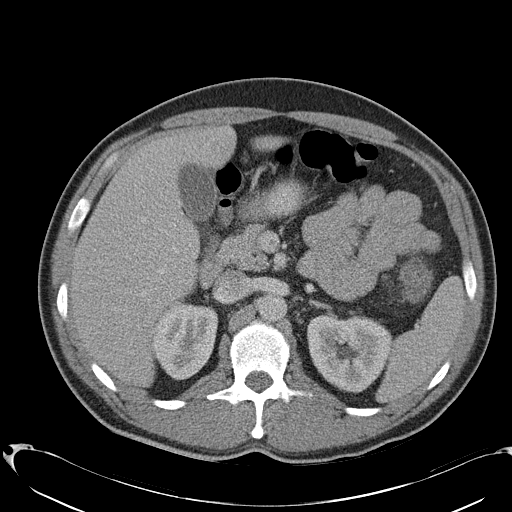
[im 73/101  soft-tissue]
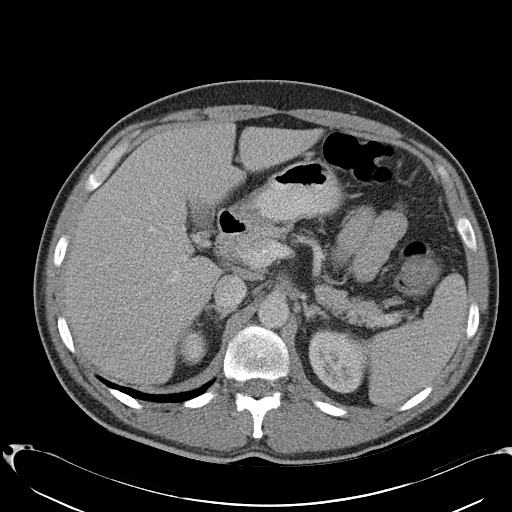
[im 82/101  soft-tissue]
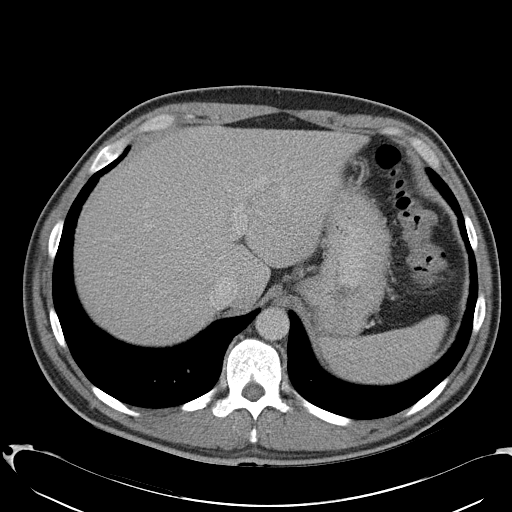
[im 87/101  soft-tissue]
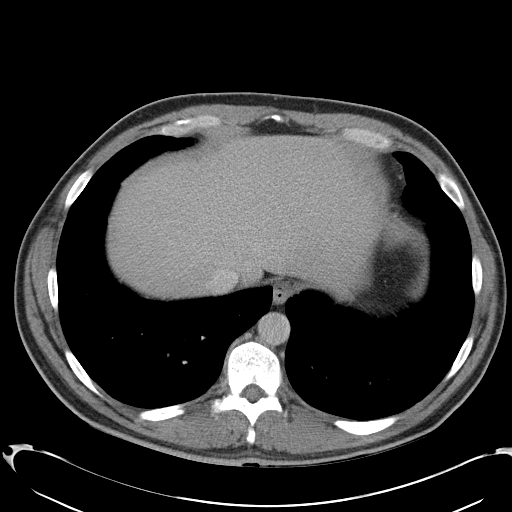
[im 96/101  soft-tissue]
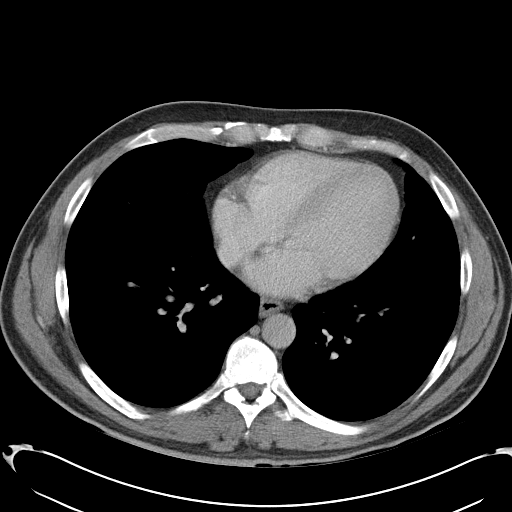

[Series 3: abd_pel_with 3.0 spo cor · coronal · 0.81mm/px · 3 of 91 slices shown]
[im 31/91  soft-tissue]
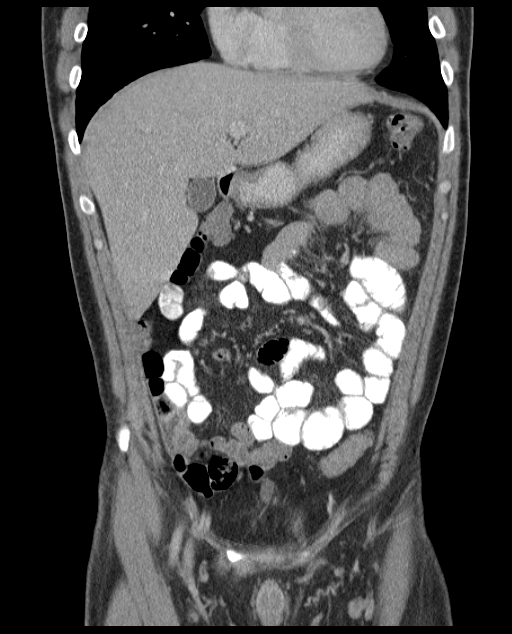
[im 41/91  soft-tissue]
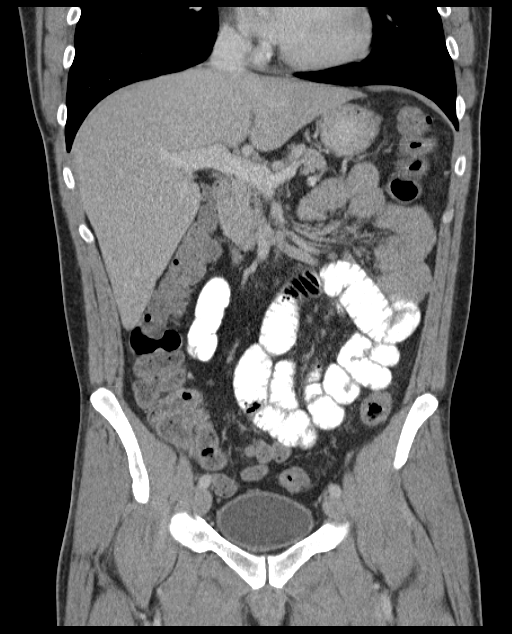
[im 51/91  soft-tissue]
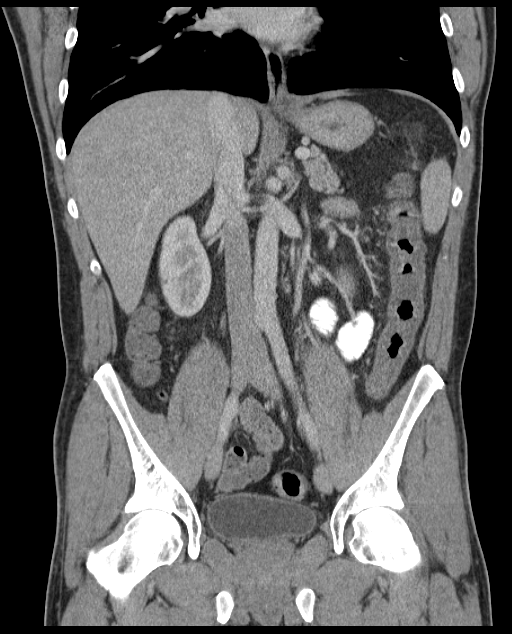

[17 of 46 positions shown; findings below may reference images not displayed]

FINDINGS: There is edema and mural thickening involving the descending colon.
There is mild pericolic inflammatory stranding. The findings are
most consistent with colitis or ischemia. There is no extraluminal
air. There is no abscess. There is no bowel obstruction. Mild
colonic diverticulosis is present, but the acute findings do not
have the appearance of diverticulitis.

There are normal appearances of the liver, spleen, pancreas,
adrenals and kidneys. Gallbladder and bile ducts appear
unremarkable.

There is no significant abnormality in the lower chest.

There is no significant musculoskeletal abnormality.
IMPRESSION: Mural thickening and edema of the descending colon with appearances
consistent with colitis or ischemia.

## 2017-09-26 ENCOUNTER — Ambulatory Visit: Payer: Self-pay | Admitting: Family

## 2017-10-16 ENCOUNTER — Encounter: Payer: Self-pay | Admitting: Family

## 2017-10-16 ENCOUNTER — Ambulatory Visit (INDEPENDENT_AMBULATORY_CARE_PROVIDER_SITE_OTHER): Payer: BLUE CROSS/BLUE SHIELD | Admitting: Family

## 2017-10-16 VITALS — BP 131/87 | HR 84 | Temp 98.4°F | Ht 72.0 in | Wt 218.6 lb

## 2017-10-16 DIAGNOSIS — K529 Noninfective gastroenteritis and colitis, unspecified: Secondary | ICD-10-CM

## 2017-10-16 DIAGNOSIS — K219 Gastro-esophageal reflux disease without esophagitis: Secondary | ICD-10-CM

## 2017-10-16 DIAGNOSIS — E663 Overweight: Secondary | ICD-10-CM | POA: Diagnosis not present

## 2017-10-16 DIAGNOSIS — Z0000 Encounter for general adult medical examination without abnormal findings: Secondary | ICD-10-CM

## 2017-10-16 DIAGNOSIS — F1120 Opioid dependence, uncomplicated: Secondary | ICD-10-CM

## 2017-10-16 DIAGNOSIS — F1410 Cocaine abuse, uncomplicated: Secondary | ICD-10-CM

## 2017-10-16 DIAGNOSIS — F17200 Nicotine dependence, unspecified, uncomplicated: Secondary | ICD-10-CM | POA: Diagnosis not present

## 2017-10-16 MED ORDER — OMEPRAZOLE 20 MG PO CPDR: 20.0000 mg | DELAYED_RELEASE_CAPSULE | Freq: Every day | ORAL | 3 refills | Status: DC

## 2017-10-16 NOTE — Patient Instructions (Signed)

## 2017-10-16 NOTE — Progress Notes (Signed)
Subjective:    Patient ID: Phillip Abbott, male    DOB: 07-24-1980, 37 y.o.   MRN: [id number]  PT presents to the office to establish care and CPE. PT goes to Suboxone clinic once a week hx of cocaine and narcotic abuse.  Pt states he has been diagnosed with colitis in the past and noticed some dark red blood in his stools about three weeks ago. States he has not seen any more blood since then. States he has intermittent lower abdominal pain 8 out 10. States he has seen a GI doctor in the past who recommended he change his diet.  Gastroesophageal Reflux  He complains of abdominal pain, belching and heartburn. He reports no choking or no coughing. This is a chronic problem. The current episode started more than 1 year ago. The problem occurs occasionally. He has tried an antacid for the symptoms. The treatment provided mild relief.      Review of Systems  Respiratory: Negative for cough and choking.   Gastrointestinal: Positive for abdominal pain and heartburn.  All other systems reviewed and are negative.  Family History  Problem Relation Age of Onset  . Hyperlipidemia Mother   . Hypertension Mother   . Depression Mother   . Heart disease Father   . Hyperlipidemia Father   . Alcohol abuse Maternal Uncle   . Heart disease Maternal Grandmother   . Heart disease Maternal Grandfather   . Cancer Paternal Grandmother        liver   Social History   Socioeconomic History  . Marital status: Single    Spouse name: Not on file  . Number of children: Not on file  . Years of education: Not on file  . Highest education level: Not on file  Occupational History  . Not on file  Social Needs  . Financial resource strain: Not on file  . Food insecurity:    Worry: Not on file    Inability: Not on file  . Transportation needs:    Medical: Not on file    Non-medical: Not on file  Tobacco Use  . Smoking status: Current Every Day Smoker    Packs/day: 1.00    Types: Cigarettes  .  Smokeless tobacco: Never Used  Substance and Sexual Activity  . Alcohol use: Yes    Alcohol/week: 7.2 oz    Types: 12 Cans of beer per week    Comment: every other day  . Drug use: Yes    Comment: Percocets  . Sexual activity: Yes  Lifestyle  . Physical activity:    Days per week: Not on file    Minutes per session: Not on file  . Stress: Not on file  Relationships  . Social connections:    Talks on phone: Not on file    Gets together: Not on file    Attends religious service: Not on file    Active member of club or organization: Not on file    Attends meetings of clubs or organizations: Not on file    Relationship status: Not on file  Other Topics Concern  . Not on file  Social History Narrative  . Not on file       Objective:   Physical Exam  Constitutional: He is oriented to person, place, and time. He appears well-developed and well-nourished. No distress.  HENT:  Head: Normocephalic.  Right Ear: External ear normal.  Left Ear: External ear normal.  Nose: Nose normal.  Mouth/Throat: Oropharynx is clear  and moist.  Eyes: Pupils are equal, round, and reactive to light. Right eye exhibits no discharge. Left eye exhibits no discharge.  Neck: Normal range of motion. Neck supple. No thyromegaly present.  Cardiovascular: Normal rate, regular rhythm, normal heart sounds and intact distal pulses.  No murmur heard. Pulmonary/Chest: Effort normal and breath sounds normal. No respiratory distress. He has no wheezes.  Abdominal: Soft. Bowel sounds are normal. He exhibits no distension. There is no tenderness.  Musculoskeletal: Normal range of motion. He exhibits no edema or tenderness.  Neurological: He is alert and oriented to person, place, and time.  Skin: Skin is warm and dry. No rash noted. No erythema.  Psychiatric: He has a normal mood and affect. His behavior is normal. Judgment and thought content normal.  Vitals reviewed.    BP 131/87   Pulse 84   Temp 98.4 F  (36.9 C) (Oral)   Ht 6' (1.829 m)   Wt 218 lb 9.6 oz (99.2 kg)   BMI 29.65 kg/m      Assessment & Plan:  1. Annual physical exam - CMP14+EGFR - CBC with Differential/Platelet - Lipid panel - TSH - VITAMIN D 25 Hydroxy (Vit-D Deficiency, Fractures) - HIV antibody  2. Colitis Resolved at this time - CMP14+EGFR - CBC with Differential/Platelet  3. Current smoker Smoking cessation discussed - CMP14+EGFR - CBC with Differential/Platelet  4. Overweight (BMI 25.0-29.9) - CMP14+EGFR - CBC with Differential/Platelet  5. Gastroesophageal reflux disease, esophagitis presence not specified -Diet discussed- Avoid fried, spicy, citrus foods, caffeine and alcohol -Do not eat 2-3 hours before bedtime -Encouraged small frequent meals -Avoid NSAID's -PT does not wish to have a GI referral  - CMP14+EGFR - CBC with Differential/Platelet - omeprazole (PRILOSEC) 20 MG capsule; Take 1 capsule (20 mg total) by mouth daily.  Dispense: 30 capsule; Refill: 3  6. Cocaine abuse (HCC) - CMP14+EGFR - CBC with Differential/Platelet - HIV antibody  7. Narcotic addiction ([facility]) - CMP14+EGFR - CBC with Differential/Platelet - HIV antibody   Continue all meds Labs pending Health Maintenance reviewed Diet and exercise encouraged RTO 6 months   Evelina Dun, FNP

## 2017-10-17 ENCOUNTER — Other Ambulatory Visit: Payer: Self-pay | Admitting: Family

## 2017-10-17 DIAGNOSIS — E559 Vitamin D deficiency, unspecified: Secondary | ICD-10-CM | POA: Insufficient documentation

## 2017-10-17 LAB — CMP14+EGFR
ALT: 61 IU/L — ABNORMAL HIGH (ref 0–44)
AST: 31 IU/L (ref 0–40)
Albumin/Globulin Ratio: 1.6 (ref 1.2–2.2)
Albumin: 4.6 g/dL (ref 3.5–5.5)
Alkaline Phosphatase: 69 IU/L (ref 39–117)
BUN/Creatinine Ratio: 13 (ref 9–20)
BUN: 12 mg/dL (ref 6–20)
Bilirubin Total: 0.3 mg/dL (ref 0.0–1.2)
CO2: 24 mmol/L (ref 20–29)
Calcium: 10.2 mg/dL (ref 8.7–10.2)
Chloride: 98 mmol/L (ref 96–106)
Creatinine, Ser: 0.92 mg/dL (ref 0.76–1.27)
GFR calc Af Amer: 124 mL/min/{1.73_m2} (ref >59)
GFR calc non Af Amer: 107 mL/min/{1.73_m2} (ref >59)
Globulin, Total: 2.8 g/dL (ref 1.5–4.5)
Glucose: 87 mg/dL (ref 65–99)
Potassium: 4.6 mmol/L (ref 3.5–5.2)
Sodium: 138 mmol/L (ref 134–144)
Total Protein: 7.4 g/dL (ref 6.0–8.5)

## 2017-10-17 LAB — TSH: TSH: 1.7 u[IU]/mL (ref 0.450–4.500)

## 2017-10-17 LAB — CBC WITH DIFFERENTIAL/PLATELET
Basophils Absolute: 0 10*3/uL (ref 0.0–0.2)
Basos: 0 %
EOS (ABSOLUTE): 0.3 10*3/uL (ref 0.0–0.4)
Eos: 3 %
Hematocrit: 47.4 % (ref 37.5–51.0)
Hemoglobin: 16.6 g/dL (ref 13.0–17.7)
Immature Grans (Abs): 0 10*3/uL (ref 0.0–0.1)
Immature Granulocytes: 0 %
Lymphocytes Absolute: 2.7 10*3/uL (ref 0.7–3.1)
Lymphs: 32 %
MCH: 30.5 pg (ref 26.6–33.0)
MCHC: 35 g/dL (ref 31.5–35.7)
MCV: 87 fL (ref 79–97)
Monocytes Absolute: 1.1 10*3/uL — ABNORMAL HIGH (ref 0.1–0.9)
Monocytes: 13 %
Neutrophils Absolute: 4.3 10*3/uL (ref 1.4–7.0)
Neutrophils: 52 %
Platelets: 313 10*3/uL (ref 150–379)
RBC: 5.44 x10E6/uL (ref 4.14–5.80)
RDW: 14.3 % (ref 12.3–15.4)
WBC: 8.5 10*3/uL (ref 3.4–10.8)

## 2017-10-17 LAB — LIPID PANEL
Chol/HDL Ratio: 5.5 ratio — ABNORMAL HIGH (ref 0.0–5.0)
Cholesterol, Total: 231 mg/dL — ABNORMAL HIGH (ref 100–199)
HDL: 42 mg/dL (ref >39)
LDL Calculated: 132 mg/dL — ABNORMAL HIGH (ref 0–99)
Triglycerides: 286 mg/dL — ABNORMAL HIGH (ref 0–149)
VLDL Cholesterol Cal: 57 mg/dL — ABNORMAL HIGH (ref 5–40)

## 2017-10-17 LAB — HIV ANTIBODY (ROUTINE TESTING W REFLEX): HIV Screen 4th Generation wRfx: NONREACTIVE

## 2017-10-17 LAB — VITAMIN D 25 HYDROXY (VIT D DEFICIENCY, FRACTURES): Vit D, 25-Hydroxy: 15.4 ng/mL — ABNORMAL LOW (ref 30.0–100.0)

## 2017-10-17 MED ORDER — VITAMIN D (ERGOCALCIFEROL) 1.25 MG (50000 UNIT) PO CAPS: 50000.0000 [IU] | ORAL_CAPSULE | ORAL | 3 refills | Status: DC

## 2017-11-07 ENCOUNTER — Telehealth: Payer: Self-pay | Admitting: Family

## 2017-11-07 ENCOUNTER — Other Ambulatory Visit: Payer: Self-pay | Admitting: *Deleted

## 2017-11-07 MED ORDER — VITAMIN D (ERGOCALCIFEROL) 1.25 MG (50000 UNIT) PO CAPS: 50000.0000 [IU] | ORAL_CAPSULE | ORAL | 3 refills | Status: DC

## 2017-11-07 NOTE — Telephone Encounter (Signed)
Script sent to [facility] 

## 2017-11-08 ENCOUNTER — Ambulatory Visit: Payer: BLUE CROSS/BLUE SHIELD | Admitting: Family

## 2017-11-11 ENCOUNTER — Encounter: Payer: Self-pay | Admitting: Family

## 2018-01-07 ENCOUNTER — Ambulatory Visit: Payer: BLUE CROSS/BLUE SHIELD | Admitting: Family Medicine

## 2018-01-08 ENCOUNTER — Encounter: Payer: Self-pay | Admitting: Family Medicine

## 2018-01-08 ENCOUNTER — Ambulatory Visit: Payer: BLUE CROSS/BLUE SHIELD | Admitting: Family Medicine

## 2018-01-08 VITALS — BP 131/89 | HR 67 | Temp 97.4°F | Ht 72.0 in | Wt 216.4 lb

## 2018-01-08 DIAGNOSIS — L03116 Cellulitis of left lower limb: Secondary | ICD-10-CM | POA: Diagnosis not present

## 2018-01-08 MED ORDER — SULFAMETHOXAZOLE-TRIMETHOPRIM 800-160 MG PO TABS: 1.0000 | ORAL_TABLET | Freq: Two times a day (BID) | ORAL | 0 refills | Status: DC

## 2018-01-08 NOTE — Progress Notes (Signed)
BP 131/89   Pulse 67   Temp (!) 97.4 F (36.3 C) (Oral)   Ht 6' (1.829 m)   Wt 216 lb 6.4 oz (98.2 kg)   BMI 29.35 kg/m    Subjective:    Patient ID: Phillip Abbott Vandezande, male    DOB: [birth year], 37 y.o.   MRN: [id number]  HPI: Phillip Abbott [place] is a 37 y.o. male presenting on 01/08/2018 for Verrucous Vulgaris (bottom of Left foot)   HPI Wart that is painful Patient has a few plantar warts, specifically one on the plantar surface of his left foot in the middle and 2 in the middle of the right foot.  He says they have been there for quite many years but just recently he tried to cut on the one on his left foot and it is becoming exquisitely more painful since that time.  Patient denies any fevers or chills or drainage from the site but does have a lot of pain and some redness around the wart on the bottom of his left foot.  The 2 on his right foot are not as painful.  Relevant past medical, surgical, family and social history reviewed and updated as indicated. Interim medical history since our last visit reviewed. Allergies and medications reviewed and updated.  Review of Systems  Constitutional: Negative for chills and fever.  Respiratory: Negative for shortness of breath and wheezing.   Cardiovascular: Negative for chest pain and leg swelling.  Musculoskeletal: Negative for back pain and gait problem.  Skin: Positive for color change. Negative for rash.  All other systems reviewed and are negative.   Per HPI unless specifically indicated above   Allergies as of 01/08/2018   No Known Allergies     Medication List        Accurate as of 01/08/18  9:24 AM. Always use your most recent med list.          omeprazole 20 MG capsule Commonly known as:  PRILOSEC Take 1 capsule (20 mg total) by mouth daily.   SUBOXONE 4-1 MG Film Generic drug:  Buprenorphine HCl-Naloxone HCl   Buprenorphine HCl-Naloxone HCl 8-2 MG Film   sulfamethoxazole-trimethoprim 800-160 MG tablet Commonly  known as:  BACTRIM DS,SEPTRA DS Take 1 tablet by mouth 2 (two) times daily.   Vitamin D (Ergocalciferol) 50000 units Caps capsule Commonly known as:  DRISDOL Take 1 capsule (50,000 Units total) by mouth every 7 (seven) days.          Objective:    BP 131/89   Pulse 67   Temp (!) 97.4 F (36.3 C) (Oral)   Ht 6' (1.829 m)   Wt 216 lb 6.4 oz (98.2 kg)   BMI 29.35 kg/m   Wt Readings from Last 3 Encounters:  01/08/18 216 lb 6.4 oz (98.2 kg)  10/16/17 218 lb 9.6 oz (99.2 kg)  11/10/14 198 lb 4.8 oz (89.9 kg)    Physical Exam  Constitutional: He is oriented to person, place, and time. He appears well-developed and well-nourished. No distress.  Eyes: Conjunctivae are normal. No scleral icterus.  Neurological: He is alert and oriented to person, place, and time. Coordination normal.  Skin: Skin is warm and dry. Lesion (Plantar wart on the bottom of his left foot surrounding erythema and some fluctuance) noted. No rash noted. He is not diaphoretic. There is erythema.  2 small plantar warts on his right foot, no signs of infection  Psychiatric: He has a normal mood and affect. His behavior is normal.  Nursing note and vitals reviewed.   I&D: Region was anesthetized with topical ethyl chloride. Incision was made on anterior medial aspect of the abscess/4.  Small amount of sanguinous drainage was exuded.  Wound was dressed with 4 x 4 and tape, instructed to change every day. Bleeding was minimal and patient tolerated procedure well.      Assessment & Plan:   Problem List Items Addressed This Visit    None    Visit Diagnoses    Cellulitis of left lower extremity    -  Primary   Patient tried to cut on a wart himself and it looks like it is become infected, will give antibiotic and did I&D, return for wart treatment once infection gone   Relevant Medications   sulfamethoxazole-trimethoprim (BACTRIM DS,SEPTRA DS) 800-160 MG tablet      Will treat infection and have come back for  plantar wart treatment in 2 to 3 weeks. Follow up plan: Return if symptoms worsen or fail to improve.  Counseling provided for all of the vaccine components No orders of the defined types were placed in this encounter.   Arville Care, MD [facility] 01/08/2018, 9:24 AM

## 2018-01-24 ENCOUNTER — Ambulatory Visit: Payer: BLUE CROSS/BLUE SHIELD | Admitting: Family

## 2018-02-09 ENCOUNTER — Other Ambulatory Visit: Payer: Self-pay | Admitting: Family

## 2018-02-09 DIAGNOSIS — K219 Gastro-esophageal reflux disease without esophagitis: Secondary | ICD-10-CM

## 2018-04-17 ENCOUNTER — Ambulatory Visit: Payer: BLUE CROSS/BLUE SHIELD | Admitting: Family

## 2018-04-24 ENCOUNTER — Encounter: Payer: Self-pay | Admitting: Family

## 2018-08-06 ENCOUNTER — Ambulatory Visit (INDEPENDENT_AMBULATORY_CARE_PROVIDER_SITE_OTHER): Payer: BLUE CROSS/BLUE SHIELD | Admitting: Family Medicine

## 2018-08-06 ENCOUNTER — Encounter: Payer: Self-pay | Admitting: Family Medicine

## 2018-08-06 VITALS — BP 123/78 | HR 90 | Temp 98.7°F | Ht 72.0 in | Wt 212.5 lb

## 2018-08-06 DIAGNOSIS — Z7251 High risk heterosexual behavior: Secondary | ICD-10-CM

## 2018-08-06 DIAGNOSIS — R300 Dysuria: Secondary | ICD-10-CM | POA: Diagnosis not present

## 2018-08-06 DIAGNOSIS — Z113 Encounter for screening for infections with a predominantly sexual mode of transmission: Secondary | ICD-10-CM | POA: Diagnosis not present

## 2018-08-06 DIAGNOSIS — N489 Disorder of penis, unspecified: Secondary | ICD-10-CM | POA: Diagnosis not present

## 2018-08-06 DIAGNOSIS — F1120 Opioid dependence, uncomplicated: Secondary | ICD-10-CM

## 2018-08-06 LAB — URINALYSIS
Bilirubin, UA: NEGATIVE
Glucose, UA: NEGATIVE
Leukocytes, UA: NEGATIVE
Nitrite, UA: NEGATIVE
Protein, UA: NEGATIVE
Specific Gravity, UA: 1.025 (ref 1.005–1.030)
Urobilinogen, Ur: 1 mg/dL (ref 0.2–1.0)
pH, UA: 6.5 (ref 5.0–7.5)

## 2018-08-06 MED ORDER — CIPROFLOXACIN HCL 500 MG PO TABS: 500.0000 mg | ORAL_TABLET | Freq: Two times a day (BID) | ORAL | 0 refills | Status: AC
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
## 2018-08-07 LAB — URINE CULTURE

## 2018-08-07 LAB — HIV ANTIBODY (ROUTINE TESTING W REFLEX): HIV Screen 4th Generation wRfx: NONREACTIVE

## 2018-08-08 LAB — GC/CHLAMYDIA PROBE AMP
Chlamydia trachomatis, NAA: NEGATIVE
Neisseria gonorrhoeae by PCR: NEGATIVE

## 2018-08-08 NOTE — Progress Notes (Signed)
Hello Phillip Abbott,  Your lab result is normal.Some minor variations that are not significant are commonly marked abnormal, but do not represent any medical problem for you.  Best regards, Inetta Dicke, M.D.

## 2018-08-08 NOTE — Progress Notes (Signed)
Hello Phillip Abbott,  Your lab result is normal.Some minor variations that are not significant are commonly marked abnormal, but do not represent any medical problem for you.  Best regards, Nadalyn Deringer, M.D.

## 2018-09-29 DIAGNOSIS — N486 Induration penis plastica: Secondary | ICD-10-CM | POA: Diagnosis not present

## 2018-12-05 DIAGNOSIS — R0902 Hypoxemia: Secondary | ICD-10-CM | POA: Diagnosis not present

## 2018-12-05 DIAGNOSIS — R404 Transient alteration of awareness: Secondary | ICD-10-CM | POA: Diagnosis not present

## 2018-12-05 DIAGNOSIS — R410 Disorientation, unspecified: Secondary | ICD-10-CM | POA: Diagnosis not present

## 2018-12-05 DIAGNOSIS — Z209 Contact with and (suspected) exposure to unspecified communicable disease: Secondary | ICD-10-CM | POA: Diagnosis not present

## 2020-10-11 ENCOUNTER — Other Ambulatory Visit: Payer: Self-pay

## 2020-10-11 ENCOUNTER — Emergency Department (HOSPITAL_COMMUNITY)
Admission: EM | Admit: 2020-10-11 | Discharge: 2020-10-12 | Disposition: A | Discharge status: Home / Self Care | Payer: Self-pay | Attending: Emergency Medicine | Admitting: Emergency Medicine

## 2020-10-11 ENCOUNTER — Encounter (HOSPITAL_COMMUNITY): Payer: Self-pay | Admitting: Emergency Medicine

## 2020-10-11 ENCOUNTER — Emergency Department (HOSPITAL_COMMUNITY): Payer: Self-pay

## 2020-10-11 DIAGNOSIS — R0989 Other specified symptoms and signs involving the circulatory and respiratory systems: Secondary | ICD-10-CM | POA: Insufficient documentation

## 2020-10-11 DIAGNOSIS — F17210 Nicotine dependence, cigarettes, uncomplicated: Secondary | ICD-10-CM | POA: Insufficient documentation

## 2020-10-11 DIAGNOSIS — H6121 Impacted cerumen, right ear: Secondary | ICD-10-CM | POA: Insufficient documentation

## 2020-10-11 DIAGNOSIS — Z20822 Contact with and (suspected) exposure to covid-19: Secondary | ICD-10-CM | POA: Insufficient documentation

## 2020-10-11 DIAGNOSIS — R059 Cough, unspecified: Secondary | ICD-10-CM | POA: Insufficient documentation

## 2020-10-11 MED ORDER — CARBAMIDE PEROXIDE 6.5 % OT SOLN
10.0000 [drp] | Freq: Once | OTIC | Status: AC
  Administered 2020-10-11: 10 [drp] via OTIC
  Filled 2020-10-11: qty 15

## 2020-10-11 NOTE — ED Triage Notes (Signed)
Pt states that he woke up this morning and has been unable to hear but a little. Pt states that he has had a cough, a scratchy throat, and coughing up phlegm.

## 2020-10-11 NOTE — ED Triage Notes (Signed)
Emergency Medicine Provider Triage Evaluation Note  Tulio Facundo , a 40 y.o. male  was evaluated in triage.  Pt complains of tinnitus and decreased hearing for 1 day.  He also complains of scratchy throat and cough that has been productive of thick sputum.  No fever or chills, no shortness of breath or chest pain.  Denies headache or dizziness.  Applied sweet oil to both ears earlier today without relief.  No history of trauma.  Review of Systems  Positive: Sore throat, cough, and decreased hearing Negative: Fever, headache, chest pain, shortness of breath  Physical Exam  BP 128/90   Pulse 93   Temp 98.1 F (36.7 C)   Resp 18   Ht 6' (1.829 m)   Wt 96.4 kg   SpO2 100%   BMI 28.82 kg/m  Gen:   Awake, no distress   HEENT:  Atraumatic, cerumen impaction of right auditory canal.  Unable to visualize TM.  Left TM mildly erythematous without visualized air-fluid levels or bulging. Resp:  Normal effort, lungs clear to auscultation bilaterally Cardiac:  Normal rate and rhythm Abd:   Nondistended, nontender  MSK:   Moves extremities without difficulty  Neuro:  Speech clear, no focal weakness  Medical Decision Making  Medically screening exam initiated at 10:39 PM.  Appropriate orders placed.  Ryo Klang was informed that the remainder of the evaluation will be completed by another provider, this initial triage assessment does not replace that evaluation, and the importance of remaining in the ED until their evaluation is complete.  Clinical Impression   Patient here with decreased hearing, sore throat and cough.  Cough present for several days and decreased hearing today.  On exam he has a right sided cerumen impaction which is likely cause of his decreased hearing.  He is well-appearing nontoxic.  We will also obtain chest x-ray and Covid testing.    Pauline Aus, PA-C 10/11/20 2259

## 2020-10-12 LAB — SARS CORONAVIRUS 2 (TAT 6-24 HRS): SARS Coronavirus 2: NEGATIVE

## 2020-10-12 MED ORDER — AMOXICILLIN 500 MG PO CAPS: 500.0000 mg | ORAL_CAPSULE | Freq: Three times a day (TID) | ORAL | 0 refills | Status: AC

## 2020-10-12 MED ORDER — NEOMYCIN-POLYMYXIN-HC 3.5-10000-1 OT SUSP: 4.0000 [drp] | Freq: Three times a day (TID) | OTIC | 0 refills | Status: AC

## 2020-10-12 NOTE — Discharge Instructions (Signed)
Take the antibiotics as prescribed.  Follow-up with the ear nose and throat doctor.  Return to the ED with new or worsening symptoms.

## 2020-10-12 NOTE — ED Provider Notes (Signed)
[facility] EMERGENCY DEPARTMENT Provider Note   CSN: [id number] Arrival date & time: 10/11/20  2128     History Chief Complaint  Patient presents with  . Hearing Problem    Phillip Abbott is a 40 y.o. male.  Patient with history of hypertension and substance abuse and smoking presenting with decreased hearing for the past 1 day.  States he woke up with decreased hearing bilaterally since this morning.  Was well when he went to bed last night.  Decreased more on the right than the left.  Denies any fall or trauma.  Try using sweet oil drops at home without relief.  Has had a scratchy throat and cough for the past 4 to 5 days.  No fever, chills, nausea, vomiting, chest pain or shortness of breath.  No headache or dizziness. Denies any trauma to his ears. No bleeding or drainage. Denies any earplugs or headphones use  The history is provided by the patient.       History reviewed. No pertinent past medical history.  Patient Active Problem List   Diagnosis Date Noted  . Vitamin D deficiency 10/17/2017  . Current smoker 10/16/2017  . Overweight (BMI 25.0-29.9) 10/16/2017  . Cocaine abuse (HCC) 11/11/2014  . Alcohol abuse 11/10/2014  . Colitis 11/10/2014  . Narcotic addiction (HCC) 11/10/2014  . Hematochezia 11/10/2014    Past Surgical History:  Procedure Laterality Date  . Tubes in ears Bilateral   . tubes inears         Family History  Problem Relation Age of Onset  . Hyperlipidemia Mother   . Hypertension Mother   . Depression Mother   . Heart disease Father   . Hyperlipidemia Father   . Alcohol abuse Maternal Uncle   . Heart disease Maternal Grandmother   . Heart disease Maternal Grandfather   . Cancer Paternal Grandmother        liver    Social History   Tobacco Use  . Smoking status: Current Every Day Smoker    Packs/day: 1.00    Types: Cigarettes  . Smokeless tobacco: Never Used  Vaping Use  . Vaping Use: Every day  Substance Use Topics  .  Alcohol use: Yes    Alcohol/week: 12.0 standard drinks    Types: 12 Cans of beer per week    Comment: every other day  . Drug use: Yes    Comment: Percocets    Home Medications Prior to Admission medications   Medication Sig Start Date End Date Taking? Authorizing Provider  ALPRAZolam Prudy Feeler) 0.5 MG tablet Take 0.5 mg by mouth daily. 07/18/18   [provider]  buprenorphine (SUBUTEX) 8 MG SUBL SL tablet 8 mg 2 (two) times daily. 06/13/18   [provider]  ciprofloxacin (CIPRO) 500 MG tablet Take 1 tablet (500 mg total) by mouth 2 (two) times daily. For prostate. Take all of these. 08/06/18   Mechele Claude, MD  omeprazole (PRILOSEC) 20 MG capsule TAKE ONE CAPSULE BY MOUTH DAILY 02/10/18   Junie Spencer, FNP    Allergies    Patient has no known allergies.  Review of Systems   Review of Systems  Constitutional: Negative for activity change, appetite change and fever.  HENT: Positive for congestion, ear pain, hearing loss and sore throat. Negative for ear discharge.   Respiratory: Positive for cough. Negative for chest tightness and shortness of breath.   Cardiovascular: Negative for chest pain.  Gastrointestinal: Negative for abdominal pain, nausea and vomiting.  Genitourinary: Negative  for dysuria and hematuria.  Musculoskeletal: Negative for arthralgias and myalgias.  Skin: Negative for rash.  Neurological: Negative for dizziness, weakness and headaches.   all other systems are negative except as noted in the HPI and PMH.    Physical Exam Updated Vital Signs BP 128/90   Pulse 93   Temp 98.1 F (36.7 C)   Resp 18   Ht 6' (1.829 m)   Wt 96.4 kg   SpO2 100%   BMI 28.82 kg/m   Physical Exam Vitals and nursing note reviewed.  Constitutional:      General: He is not in acute distress.    Appearance: Normal appearance. He is well-developed. He is not ill-appearing.  HENT:     Head: Normocephalic and atraumatic.     Right Ear: There is impacted cerumen.      Ears:     Comments: Impacted cerumen on right, TM not visible.  Left TM is visible and opaque. Mildly erythematous.  No cerumen on left. No tragus or mastoid pain.    Mouth/Throat:     Mouth: Mucous membranes are moist.     Pharynx: Posterior oropharyngeal erythema present. No oropharyngeal exudate.  Eyes:     Conjunctiva/sclera: Conjunctivae normal.     Pupils: Pupils are equal, round, and reactive to light.  Neck:     Comments: No meningismus. Cardiovascular:     Rate and Rhythm: Normal rate and regular rhythm.     Heart sounds: Normal heart sounds. No murmur heard.   Pulmonary:     Effort: Pulmonary effort is normal. No respiratory distress.     Breath sounds: Normal breath sounds.  Chest:     Chest wall: No tenderness.  Abdominal:     Palpations: Abdomen is soft.     Tenderness: There is no abdominal tenderness. There is no guarding or rebound.  Musculoskeletal:        General: No tenderness. Normal range of motion.     Cervical back: Normal range of motion and neck supple.  Skin:    General: Skin is warm.     Capillary Refill: Capillary refill takes less than 2 seconds.  Neurological:     General: No focal deficit present.     Mental Status: He is alert and oriented to person, place, and time. Mental status is at baseline.     Cranial Nerves: No cranial nerve deficit.     Motor: No abnormal muscle tone.     Coordination: Coordination normal.     Comments: No ataxia on finger to nose bilaterally. No pronator drift. 5/5 strength throughout. CN 2-12 intact.Equal grip strength. Sensation intact.   Psychiatric:        Behavior: Behavior normal.     ED Results / Procedures / Treatments   Labs (all labs ordered are listed, but only abnormal results are displayed) Labs Reviewed  SARS CORONAVIRUS 2 (TAT 6-24 HRS)    EKG None  Radiology DG Chest Portable 1 View  Result Date: 10/11/2020 CLINICAL DATA:  Cough, scratchy throat EXAM: PORTABLE CHEST 1 VIEW  COMPARISON:  None. FINDINGS: Slightly shallow inspiration. Heart size and pulmonary vascularity are normal for inspiratory effort. Lungs are clear. No pleural effusions. No pneumothorax. Mediastinal contours appear intact. IMPRESSION: No active disease. Electronically Signed   By: Burman Nieves M.D.   On: 10/11/2020 23:10    Procedures .Ear Cerumen Removal  Date/Time: 10/12/2020 12:21 AM Performed by: Glynn Octave, MD Authorized by: Glynn Octave, MD   Consent:  Consent obtained:  Verbal   Consent given by:  Patient   Risks, benefits, and alternatives were discussed: yes     Risks discussed:  Dizziness, incomplete removal, infection, pain, TM perforation and bleeding   Alternatives discussed:  No treatment Universal protocol:    Procedure explained and questions answered to patient or proxy's satisfaction: yes     Test results available: no     Imaging studies available: no     Required blood products, implants, devices, and special equipment available: no     Site/side marked: yes     Patient identity confirmed:  Verbally with patient Procedure details:    Location:  R ear   Procedure type: irrigation     Procedure outcomes: cerumen removed   Post-procedure details:    Inspection:  Ear canal clear and TM intact   Hearing quality:  Improved   Procedure completion:  Tolerated     Medications Ordered in ED Medications  carbamide peroxide (DEBROX) 6.5 % OTIC (EAR) solution 10 drop (10 drops Right EAR Given 10/11/20 2326)    ED Course  I have reviewed the triage vital signs and the nursing notes.  Pertinent labs & imaging results that were available during my care of the patient were reviewed by me and considered in my medical decision making (see chart for details).    MDM Rules/Calculators/A&P                         Cerumen impaction on right. No distress.  Cerumen removal performed as above with irrigation and curette. Tympanic membrane intact bilaterally  after procedure. We will treat with both topical and p.o. antibiotics and refer to ENT. Chest x-ray is negative   Hearing improving after cerumen removal.  Discussed topical antibiotics as well as p.o. antibiotics because of fluid behind left eardrum. Follow-up with ENT if hearing problems persist.  Return precautions discussed. Final Clinical Impression(s) / ED Diagnoses Final diagnoses:  Impacted cerumen of right ear    Rx / DC Orders ED Discharge Orders    None       Adair Lauderback, Jeannett Senior, MD 10/12/20 [phone]

## 2021-09-13 IMAGING — DX DG CHEST 1V PORT
1 series · 1 of 1 positions shown · non-contrast
Comparison: None.

CLINICAL DATA: Cough, scratchy throat

EXAM:
PORTABLE CHEST 1 VIEW

[chest ap]
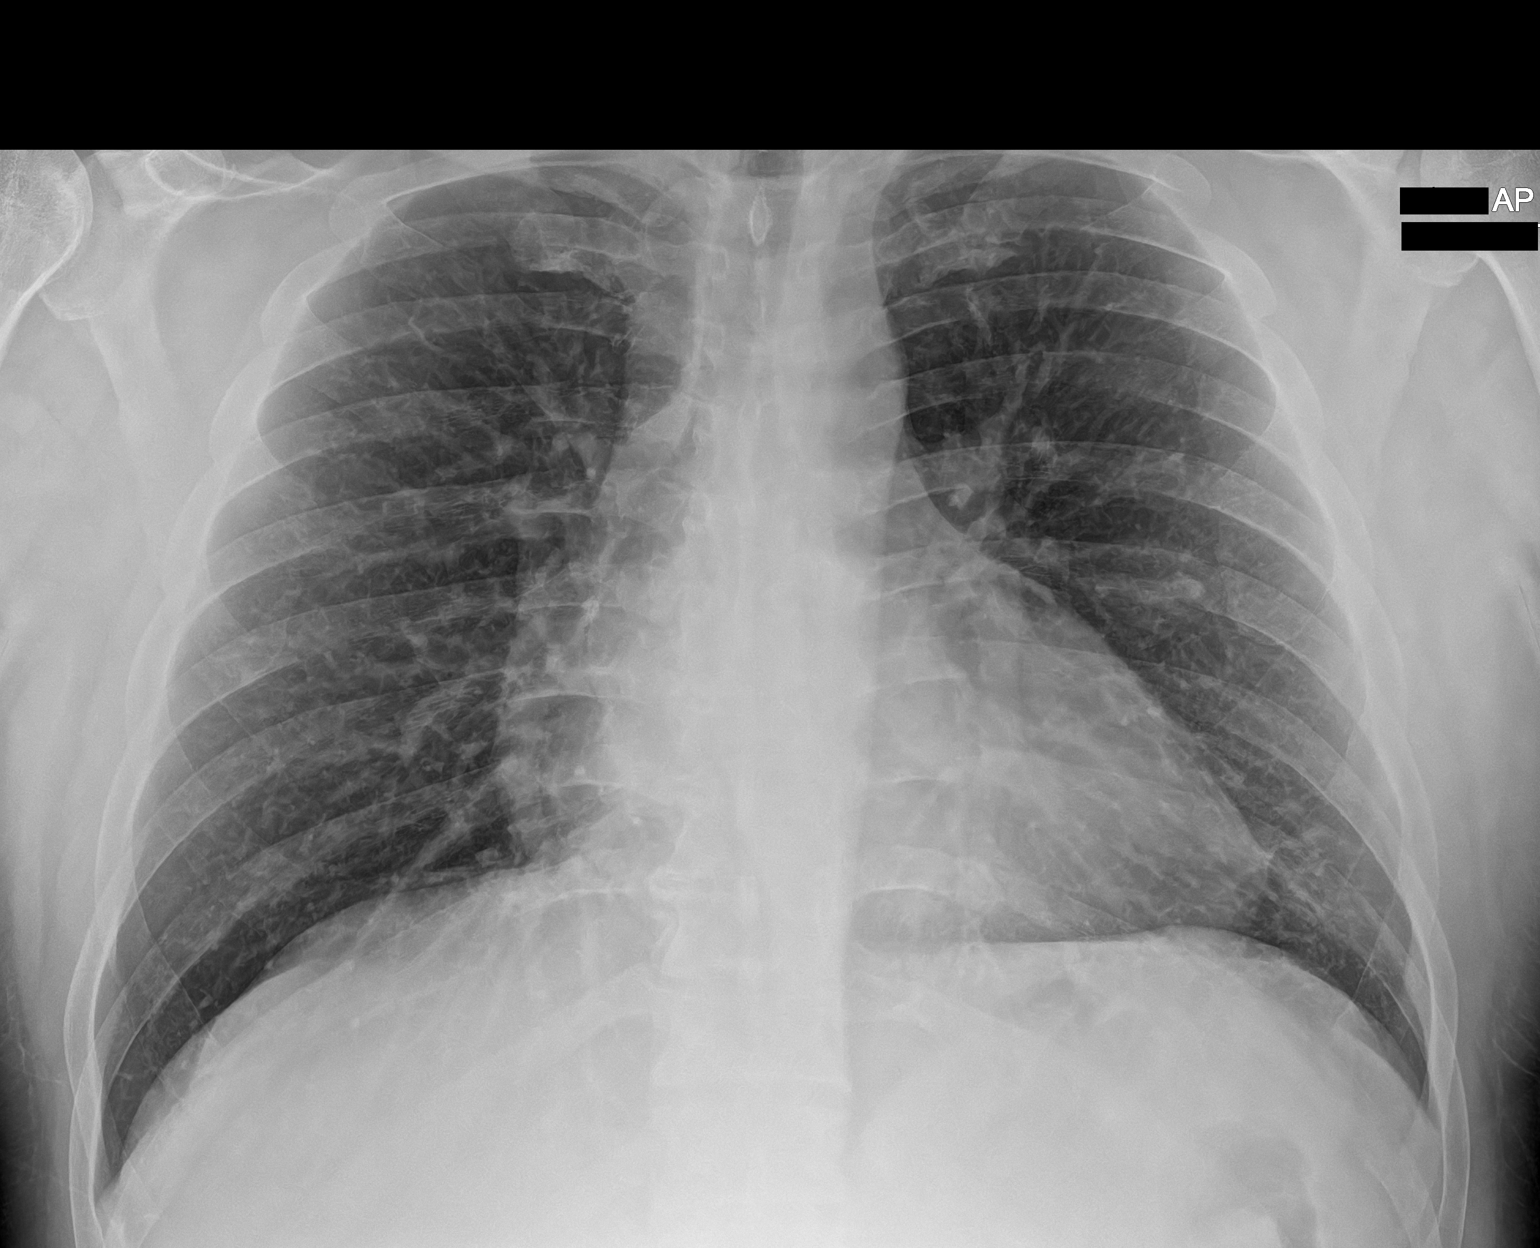

[1 of 1 positions shown; findings below may reference images not displayed]

FINDINGS: Slightly shallow inspiration. Heart size and pulmonary vascularity
are normal for inspiratory effort. Lungs are clear. No pleural
effusions. No pneumothorax. Mediastinal contours appear intact.
IMPRESSION: No active disease.
